# Patient Record
Sex: Female | Born: 1980 | Hispanic: No | Marital: Single | Smoking: Former smoker
Health system: Southern US, Community
[De-identification: ages and names within clinical notes are randomized; demographics above are authoritative.]

## PROBLEM LIST (undated history)

## (undated) DIAGNOSIS — F419 Anxiety disorder, unspecified: Secondary | ICD-10-CM

## (undated) DIAGNOSIS — F329 Major depressive disorder, single episode, unspecified: Secondary | ICD-10-CM

## (undated) DIAGNOSIS — F32A Depression, unspecified: Secondary | ICD-10-CM

## (undated) DIAGNOSIS — T7840XA Allergy, unspecified, initial encounter: Secondary | ICD-10-CM

## (undated) HISTORY — DX: Anxiety disorder, unspecified: F41.9

## (undated) HISTORY — DX: Allergy, unspecified, initial encounter: T78.40XA

## (undated) HISTORY — DX: Depression, unspecified: F32.A

## (undated) HISTORY — PX: COSMETIC SURGERY: SHX468

## (undated) HISTORY — DX: Major depressive disorder, single episode, unspecified: F32.9

---
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
## 2002-08-02 ENCOUNTER — Other Ambulatory Visit: Admission: RE | Admit: 2002-08-02 | Discharge: 2002-08-02 | Payer: Self-pay | Admitting: Obstetrics and Gynecology

## 2003-08-29 ENCOUNTER — Other Ambulatory Visit: Admission: RE | Admit: 2003-08-29 | Discharge: 2003-08-29 | Payer: Self-pay | Admitting: Obstetrics and Gynecology

## 2005-02-07 ENCOUNTER — Other Ambulatory Visit: Admission: RE | Admit: 2005-02-07 | Discharge: 2005-02-07 | Payer: Self-pay | Admitting: Obstetrics and Gynecology

## 2005-06-30 ENCOUNTER — Emergency Department (HOSPITAL_COMMUNITY): Admission: EM | Admit: 2005-06-30 | Discharge: 2005-06-30 | Payer: Self-pay | Admitting: Emergency Medicine

## 2005-07-02 ENCOUNTER — Emergency Department (HOSPITAL_COMMUNITY): Admission: EM | Admit: 2005-07-02 | Discharge: 2005-07-02 | Payer: Self-pay | Admitting: Emergency Medicine

## 2006-03-06 ENCOUNTER — Other Ambulatory Visit: Admission: RE | Admit: 2006-03-06 | Discharge: 2006-03-06 | Payer: Self-pay | Admitting: Obstetrics and Gynecology

## 2006-11-13 ENCOUNTER — Ambulatory Visit (HOSPITAL_BASED_OUTPATIENT_CLINIC_OR_DEPARTMENT_OTHER): Admission: RE | Admit: 2006-11-13 | Discharge: 2006-11-13 | Payer: Self-pay | Admitting: Urology

## 2013-04-16 ENCOUNTER — Ambulatory Visit: Payer: BC Managed Care – PPO | Admitting: Physician Assistant

## 2013-04-16 VITALS — BP 116/76 | HR 85 | Temp 99.0°F | Resp 16 | Ht 62.0 in | Wt 129.0 lb

## 2013-04-16 DIAGNOSIS — J029 Acute pharyngitis, unspecified: Secondary | ICD-10-CM

## 2013-04-16 DIAGNOSIS — R05 Cough: Secondary | ICD-10-CM

## 2013-04-16 DIAGNOSIS — J069 Acute upper respiratory infection, unspecified: Secondary | ICD-10-CM

## 2013-04-16 DIAGNOSIS — D7289 Other specified disorders of white blood cells: Secondary | ICD-10-CM

## 2013-04-16 DIAGNOSIS — R059 Cough, unspecified: Secondary | ICD-10-CM

## 2013-04-16 DIAGNOSIS — J040 Acute laryngitis: Secondary | ICD-10-CM

## 2013-04-16 LAB — GLUCOSE, POCT (MANUAL RESULT ENTRY): POC Glucose: 88 mg/dl (ref 70–99)

## 2013-04-16 LAB — POCT CBC
Granulocyte percent: 72.9 %G (ref 37–80)
HCT, POC: 41.4 % (ref 37.7–47.9)
Hemoglobin: 13.4 g/dL (ref 12.2–16.2)
Lymph, poc: 2.1 (ref 0.6–3.4)
MCH, POC: 30.5 pg (ref 27–31.2)
MCHC: 32.4 g/dL (ref 31.8–35.4)
MCV: 94.2 fL (ref 80–97)
MID (cbc): 0.7 (ref 0–0.9)
MPV: 9.7 fL (ref 0–99.8)
POC Granulocyte: 7.5 — AB (ref 2–6.9)
POC LYMPH PERCENT: 20.5 %L (ref 10–50)
POC MID %: 6.6 %M (ref 0–12)
Platelet Count, POC: 238 10*3/uL (ref 142–424)
RBC: 4.39 M/uL (ref 4.04–5.48)
RDW, POC: 13.2 %
WBC: 10.3 10*3/uL — AB (ref 4.6–10.2)

## 2013-04-16 LAB — POCT RAPID STREP A (OFFICE): Rapid Strep A Screen: NEGATIVE

## 2013-04-16 MED ORDER — HYDROCODONE-HOMATROPINE 5-1.5 MG/5ML PO SYRP: ORAL_SOLUTION | ORAL | Status: DC

## 2013-04-16 MED ORDER — PREDNISONE 20 MG PO TABS: ORAL_TABLET | ORAL | Status: DC

## 2013-04-16 MED ORDER — AMOXICILLIN-POT CLAVULANATE 875-125 MG PO TABS: 1.0000 | ORAL_TABLET | Freq: Two times a day (BID) | ORAL | Status: DC

## 2013-04-16 NOTE — Progress Notes (Signed)
Patient ID: Anne [name] MRN: [id number], DOB: December 24, 1980, 32 y.o. Date of Encounter: 04/16/2013, 4:55 PM  Primary Physician: No PCP Per Patient  Chief Complaint:  Chief Complaint  Patient presents with  . Sore Throat  . Hoarse    HPI: 32 y.o. female presents with day a 6 history of sore throat and a 1 day history of laryngitis. Subjective chills. Afebrile. Mild cough, congestion, and headache. Normal hearing. No GI complaints. Able to swallow saliva, but hurts to do so. Decreased appetite secondary to sore throat. Patient was initially seen at [facility] on 04/11/13, had a negative RST, diagnosed with viral pharyngitis, and given ibuprofen. No known sick contacts. 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      Past Medical History  Diagnosis Date  . Depression   . Allergy   . Anxiety      Home Meds: Prior to Admission medications   Medication Sig Start Date End Date Taking? Authorizing Provider  citalopram (CELEXA) 10 MG tablet Take 10 mg by mouth daily.   Yes Historical Provider, MD  clonazePAM (KLONOPIN) 0.5 MG tablet Take 0.5 mg by mouth 2 (two) times daily as needed for anxiety.   Yes Historical Provider, MD  methylphenidate (RITALIN LA) 30 MG 24 hr capsule Take 30 mg by mouth every morning.   Yes Historical Provider, MD    Allergies: No Known Allergies  History   Social History  . Marital Status: Single    Spouse Name: N/A    Number of Children: N/A  . Years of Education: N/A   Occupational History  . Not on file.   Social History Main Topics  . Smoking status: Former Games developer  . Smokeless tobacco: Not on file  . Alcohol Use: No  . Drug Use: No  . Sexually Active: Yes    Birth Control/ Protection: Pill   Other Topics Concern  . Not on file   Social History Narrative  . No narrative on file     Review of Systems: Constitutional: positive for chills. negative for fever, night sweats, fatigue, or weight  changes HEENT: see above Cardiovascular: negative for chest pain or palpitations Respiratory: positive for cough. negative for hemoptysis, wheezing, or shortness of breath Abdominal: negative for abdominal pain, nausea, vomiting or diarrhea Dermatological: negative for rash Neurologic: positive for headache   Physical Exam: Blood pressure 116/76, pulse 85, temperature 99 F (37.2 C), temperature source Oral, resp. rate 16, height 5\' 2"  (1.575 m), weight 129 lb (58.514 kg), last menstrual period 04/07/2013, SpO2 97.00%., Body mass index is 23.59 kg/(m^2). General: Well developed, well nourished, in no acute distress. Hoarse voice.  Head: Normocephalic, atraumatic, eyes without discharge, sclera non-icteric, nares are congested. Bilateral auditory canals clear, TM's are without perforation, pearly grey with reflective cone of light bilaterally. No sinus TTP. Oral cavity moist, dentition normal. Posterior pharynx erythematous with post nasal drip. No peritonsillar abscess or tonsillar exudate. Uvula midline.  Neck: Supple. No thyromegaly. Full ROM. Lymph nodes: less than 2 cm AC bilaterally. Lungs: Clear bilaterally to auscultation without wheezes, rales, or rhonchi. Breathing is unlabored. Heart: RRR with S1 S2. No murmurs, rubs, or gallops appreciated. Msk:  Strength and tone normal for age. Extremities: No clubbing or cyanosis. No edema. Neuro: Alert and oriented X 3. Moves all extremities spontaneously. CNII-XII grossly in tact. Psych:  Responds to questions appropriately with a normal affect.   Labs: Results for orders placed  in visit on 04/16/13  POCT CBC      Result Value Range   WBC 10.3 (*) 4.6 - 10.2 K/uL   Lymph, poc 2.1  0.6 - 3.4   POC LYMPH PERCENT 20.5  10 - 50 %L   MID (cbc) 0.7  0 - 0.9   POC MID % 6.6  0 - 12 %M   POC Granulocyte 7.5 (*) 2 - 6.9   Granulocyte percent 72.9  37 - 80 %G   RBC 4.39  4.04 - 5.48 M/uL   Hemoglobin 13.4  12.2 - 16.2 g/dL   HCT, POC 98.1   19.1 - 47.9 %   MCV 94.2  80 - 97 fL   MCH, POC 30.5  27 - 31.2 pg   MCHC 32.4  31.8 - 35.4 g/dL   RDW, POC 47.8     Platelet Count, POC 238  142 - 424 K/uL   MPV 9.7  0 - 99.8 fL  POCT RAPID STREP A (OFFICE)      Result Value Range   Rapid Strep A Screen Negative  Negative  GLUCOSE, POCT (MANUAL RESULT ENTRY)      Result Value Range   POC Glucose 88  70 - 99 mg/dl    Throat culture pending  ASSESSMENT AND PLAN:  32 y.o. female with pharyngitis, laryngitis, cough, and leukocytosis secondary to URI. -Augmentin 875/125 mg 1 po bid #20 no RF -Prednisone 20 mg #12 3x2, 2x2, 1x2 no RF -Hycodan #4oz 1 tsp po q 4-6 hours prn cough no RF SED -Voice rest -Tylenol prn -Rest/fluids -RTC precautions -RTC 3-5 days if no improvement  Signed, Eula Listen, PA-C 04/16/2013 4:55 PM

## 2013-04-19 LAB — CULTURE, GROUP A STREP: Organism ID, Bacteria: NORMAL

## 2013-04-22 ENCOUNTER — Telehealth: Payer: Self-pay

## 2013-04-22 NOTE — Telephone Encounter (Signed)
Patient states she is feeling weak and weird after taking medication that she was recently prescribed. [phone].

## 2013-04-22 NOTE — Telephone Encounter (Signed)
She should discontinue the hycodan; continue to take the antibiotic. She has stopped the cough meds already. I have advised her.

## 2015-02-23 ENCOUNTER — Ambulatory Visit: Payer: Self-pay | Attending: Internal Medicine

## 2021-01-30 ENCOUNTER — Other Ambulatory Visit: Payer: Self-pay | Admitting: Obstetrics and Gynecology

## 2021-01-30 DIAGNOSIS — O3670X0 Maternal care for viable fetus in abdominal pregnancy, unspecified trimester, not applicable or unspecified: Secondary | ICD-10-CM

## 2021-02-07 ENCOUNTER — Other Ambulatory Visit: Payer: Self-pay | Admitting: Obstetrics and Gynecology

## 2021-02-07 ENCOUNTER — Ambulatory Visit
Admission: RE | Admit: 2021-02-07 | Discharge: 2021-02-07 | Disposition: A | Discharge status: Home / Self Care | Payer: 59 | Source: Ambulatory Visit | Attending: Obstetrics and Gynecology | Admitting: Obstetrics and Gynecology

## 2021-02-07 ENCOUNTER — Other Ambulatory Visit: Payer: Self-pay

## 2021-02-07 DIAGNOSIS — O3670X0 Maternal care for viable fetus in abdominal pregnancy, unspecified trimester, not applicable or unspecified: Secondary | ICD-10-CM | POA: Diagnosis present

## 2021-07-08 ENCOUNTER — Other Ambulatory Visit (HOSPITAL_COMMUNITY): Payer: Self-pay | Admitting: Obstetrics and Gynecology

## 2021-07-08 ENCOUNTER — Other Ambulatory Visit: Payer: Self-pay

## 2021-07-08 ENCOUNTER — Ambulatory Visit (HOSPITAL_COMMUNITY)
Admission: RE | Admit: 2021-07-08 | Discharge: 2021-07-08 | Disposition: A | Discharge status: Home / Self Care | Payer: 59 | Source: Ambulatory Visit | Attending: Obstetrics and Gynecology | Admitting: Obstetrics and Gynecology

## 2021-07-08 DIAGNOSIS — M79604 Pain in right leg: Secondary | ICD-10-CM | POA: Diagnosis not present

## 2021-07-08 DIAGNOSIS — M79606 Pain in leg, unspecified: Secondary | ICD-10-CM | POA: Diagnosis not present

## 2021-09-05 ENCOUNTER — Inpatient Hospital Stay (HOSPITAL_COMMUNITY): Payer: 59 | Admitting: Anesthesiology

## 2021-09-05 ENCOUNTER — Encounter (HOSPITAL_COMMUNITY): Payer: Self-pay | Admitting: Obstetrics and Gynecology

## 2021-09-05 ENCOUNTER — Inpatient Hospital Stay (HOSPITAL_BASED_OUTPATIENT_CLINIC_OR_DEPARTMENT_OTHER): Payer: 59

## 2021-09-05 ENCOUNTER — Other Ambulatory Visit: Payer: Self-pay

## 2021-09-05 ENCOUNTER — Inpatient Hospital Stay (HOSPITAL_COMMUNITY)
Admission: AD | Admit: 2021-09-05 | Discharge: 2021-09-08 | DRG: 786 | Disposition: A | Discharge status: Home / Self Care | Payer: 59 | Attending: Obstetrics and Gynecology | Admitting: Obstetrics and Gynecology

## 2021-09-05 DIAGNOSIS — Z20822 Contact with and (suspected) exposure to covid-19: Secondary | ICD-10-CM | POA: Diagnosis present

## 2021-09-05 DIAGNOSIS — Z87891 Personal history of nicotine dependence: Secondary | ICD-10-CM | POA: Diagnosis not present

## 2021-09-05 DIAGNOSIS — Z3A39 39 weeks gestation of pregnancy: Secondary | ICD-10-CM | POA: Diagnosis not present

## 2021-09-05 DIAGNOSIS — Z3A40 40 weeks gestation of pregnancy: Secondary | ICD-10-CM

## 2021-09-05 DIAGNOSIS — O43123 Velamentous insertion of umbilical cord, third trimester: Secondary | ICD-10-CM | POA: Diagnosis present

## 2021-09-05 DIAGNOSIS — O4103X1 Oligohydramnios, third trimester, fetus 1: Secondary | ICD-10-CM | POA: Diagnosis present

## 2021-09-05 DIAGNOSIS — O411230 Chorioamnionitis, third trimester, not applicable or unspecified: Secondary | ICD-10-CM | POA: Diagnosis present

## 2021-09-05 DIAGNOSIS — O4103X0 Oligohydramnios, third trimester, not applicable or unspecified: Secondary | ICD-10-CM | POA: Diagnosis present

## 2021-09-05 LAB — TYPE AND SCREEN
ABO/RH(D): O POS
Antibody Screen: NEGATIVE

## 2021-09-05 LAB — CBC
HCT: 39.3 % (ref 36.0–46.0)
Hemoglobin: 12.9 g/dL (ref 12.0–15.0)
MCH: 30.8 pg (ref 26.0–34.0)
MCHC: 32.8 g/dL (ref 30.0–36.0)
MCV: 93.8 fL (ref 80.0–100.0)
Platelets: 234 10*3/uL (ref 150–400)
RBC: 4.19 MIL/uL (ref 3.87–5.11)
RDW: 13.1 % (ref 11.5–15.5)
WBC: 14.3 10*3/uL — ABNORMAL HIGH (ref 4.0–10.5)
nRBC: 0 % (ref 0.0–0.2)

## 2021-09-05 LAB — RESP PANEL BY RT-PCR (FLU A&B, COVID) ARPGX2
Influenza A by PCR: NEGATIVE
Influenza B by PCR: NEGATIVE
SARS Coronavirus 2 by RT PCR: NEGATIVE

## 2021-09-05 MED ORDER — OXYCODONE-ACETAMINOPHEN 5-325 MG PO TABS: 2.0000 | ORAL_TABLET | ORAL | Status: DC | PRN

## 2021-09-05 MED ORDER — EPHEDRINE 5 MG/ML INJ: 10.0000 mg | INTRAVENOUS | Status: DC | PRN

## 2021-09-05 MED ORDER — FENTANYL-BUPIVACAINE-NACL 0.5-0.125-0.9 MG/250ML-% EP SOLN
12.0000 mL/h | EPIDURAL | Status: DC | PRN
  Administered 2021-09-05: 12 mL/h via EPIDURAL
  Filled 2021-09-05: qty 250

## 2021-09-05 MED ORDER — PHENYLEPHRINE 40 MCG/ML (10ML) SYRINGE FOR IV PUSH (FOR BLOOD PRESSURE SUPPORT)
80.0000 ug | PREFILLED_SYRINGE | INTRAVENOUS | Status: AC | PRN
  Administered 2021-09-05 (×3): 80 ug via INTRAVENOUS

## 2021-09-05 MED ORDER — ACETAMINOPHEN 325 MG PO TABS: 650.0000 mg | ORAL_TABLET | ORAL | Status: DC | PRN

## 2021-09-05 MED ORDER — OXYTOCIN-SODIUM CHLORIDE 30-0.9 UT/500ML-% IV SOLN
1.0000 m[IU]/min | INTRAVENOUS | Status: DC
  Administered 2021-09-05: 2 m[IU]/min via INTRAVENOUS
  Filled 2021-09-05: qty 500

## 2021-09-05 MED ORDER — ONDANSETRON HCL 4 MG/2ML IJ SOLN
4.0000 mg | Freq: Four times a day (QID) | INTRAMUSCULAR | Status: DC | PRN
Start: 2021-09-05 — End: 2021-09-06

## 2021-09-05 MED ORDER — PHENYLEPHRINE 40 MCG/ML (10ML) SYRINGE FOR IV PUSH (FOR BLOOD PRESSURE SUPPORT)
80.0000 ug | PREFILLED_SYRINGE | INTRAVENOUS | Status: DC | PRN
  Filled 2021-09-05: qty 10

## 2021-09-05 MED ORDER — LACTATED RINGERS IV SOLN: INTRAVENOUS | Status: DC

## 2021-09-05 MED ORDER — EPHEDRINE 5 MG/ML INJ
10.0000 mg | INTRAVENOUS | Status: DC | PRN
  Administered 2021-09-05: 10 mg via INTRAVENOUS
  Filled 2021-09-05: qty 5

## 2021-09-05 MED ORDER — OXYTOCIN 10 UNIT/ML IJ SOLN: 10.0000 [IU] | Freq: Once | INTRAMUSCULAR | Status: DC

## 2021-09-05 MED ORDER — LACTATED RINGERS AMNIOINFUSION: INTRAVENOUS | Status: DC

## 2021-09-05 MED ORDER — LIDOCAINE HCL (PF) 1 % IJ SOLN
INTRAMUSCULAR | Status: DC | PRN
  Administered 2021-09-05: 8 mL via EPIDURAL

## 2021-09-05 MED ORDER — LACTATED RINGERS IV SOLN: 500.0000 mL | INTRAVENOUS | Status: DC | PRN

## 2021-09-05 MED ORDER — OXYTOCIN-SODIUM CHLORIDE 30-0.9 UT/500ML-% IV SOLN: 2.5000 [IU]/h | INTRAVENOUS | Status: DC

## 2021-09-05 MED ORDER — LACTATED RINGERS IV SOLN
500.0000 mL | Freq: Once | INTRAVENOUS | Status: AC
  Administered 2021-09-05: 500 mL via INTRAVENOUS

## 2021-09-05 MED ORDER — SOD CITRATE-CITRIC ACID 500-334 MG/5ML PO SOLN
30.0000 mL | ORAL | Status: DC | PRN
  Administered 2021-09-06: 30 mL via ORAL
  Filled 2021-09-05: qty 30

## 2021-09-05 MED ORDER — LIDOCAINE HCL (PF) 1 % IJ SOLN: 30.0000 mL | INTRAMUSCULAR | Status: DC | PRN

## 2021-09-05 MED ORDER — OXYTOCIN BOLUS FROM INFUSION: 333.0000 mL | Freq: Once | INTRAVENOUS | Status: DC

## 2021-09-05 MED ORDER — DIPHENHYDRAMINE HCL 50 MG/ML IJ SOLN: 12.5000 mg | INTRAMUSCULAR | Status: DC | PRN

## 2021-09-05 MED ORDER — TERBUTALINE SULFATE 1 MG/ML IJ SOLN: 0.2500 mg | Freq: Once | INTRAMUSCULAR | Status: DC | PRN

## 2021-09-05 MED ORDER — OXYCODONE-ACETAMINOPHEN 5-325 MG PO TABS
1.0000 | ORAL_TABLET | ORAL | Status: DC | PRN
Start: 2021-09-05 — End: 2021-09-06

## 2021-09-05 NOTE — Anesthesia Procedure Notes (Signed)
Epidural Patient location during procedure: OB Start time: 09/05/2021 8:05 PM End time: 09/05/2021 8:15 PM  Staffing Anesthesiologist: Mellody Dance, MD Performed: anesthesiologist   Preanesthetic Checklist Completed: patient identified, IV checked, site marked, risks and benefits discussed, monitors and equipment checked, pre-op evaluation and timeout performed  Epidural Patient position: sitting Prep: DuraPrep Patient monitoring: heart rate, cardiac monitor, continuous pulse ox and blood pressure Approach: midline Location: L2-L3 Injection technique: LOR saline  Needle:  Needle type: Tuohy  Needle gauge: 17 G Needle length: 9 cm Needle insertion depth: 5 cm Catheter type: closed end flexible Catheter size: 20 Guage Catheter at skin depth: 10 cm Test dose: negative and Other  Assessment Events: blood not aspirated, injection not painful, no injection resistance and negative IV test  Additional Notes Informed consent obtained prior to proceeding including risk of failure, 1% risk of PDPH, risk of minor discomfort and bruising.  Discussed rare but serious complications including epidural abscess, permanent nerve injury, epidural hematoma.  Discussed alternatives to epidural analgesia and patient desires to proceed.  Timeout performed pre-procedure verifying patient name, procedure, and platelet count.  Patient tolerated procedure well.

## 2021-09-05 NOTE — Progress Notes (Signed)
Pt sent from the office for BPP  due to Prolonged NR NST after c/o ctx since Monday.   Pt had declined induction for marginal cord insertion.  Called by RN regarding still  NR NST with  BPP 6/10 ( -2 for fluid) and deceleration in the ultrasound room. Called pt to advised of need for admission and delivery via induction. Pt reluctant. Advised her this is not an optional induction due to the findings and that she would have to sign out against medical advised. She then stated she would need to talk to her husband. Per RN, pt now agrees to be admitted. Orders placed. Defer  IV pain med due to tracing. May do nitrous oxide or epidural

## 2021-09-05 NOTE — Anesthesia Preprocedure Evaluation (Addendum)
Anesthesia Evaluation  Patient identified by MRN, date of birth, ID band Patient awake    Reviewed: Allergy & Precautions, NPO status , Patient's Chart, lab work & pertinent test results  Airway Mallampati: II  TM Distance: >3 FB Neck ROM: Full    Dental no notable dental hx.    Pulmonary neg pulmonary ROS, former smoker,    Pulmonary exam normal breath sounds clear to auscultation       Cardiovascular negative cardio ROS Normal cardiovascular exam Rhythm:Regular Rate:Normal     Neuro/Psych PSYCHIATRIC DISORDERS Anxiety Depression negative neurological ROS     GI/Hepatic negative GI ROS, Neg liver ROS,   Endo/Other  negative endocrine ROS  Renal/GU negative Renal ROS  negative genitourinary   Musculoskeletal negative musculoskeletal ROS (+)   Abdominal   Peds negative pediatric ROS (+)  Hematology negative hematology ROS (+)   Anesthesia Other Findings   Reproductive/Obstetrics (+) Pregnancy                             Anesthesia Physical Anesthesia Plan  ASA: 2  Anesthesia Plan: Epidural   Post-op Pain Management:    Induction:   PONV Risk Score and Plan: 2 and Treatment may vary due to age or medical condition  Airway Management Planned: Natural Airway  Additional Equipment:   Intra-op Plan:   Post-operative Plan:   Informed Consent: I have reviewed the patients History and Physical, chart, labs and discussed the procedure including the risks, benefits and alternatives for the proposed anesthesia with the patient or authorized representative who has indicated his/her understanding and acceptance.       Plan Discussed with: Anesthesiologist  Anesthesia Plan Comments: (C section called by Dr. Cherly Hensen. Plan to dose epidural for surgical anesthesia, patient has been comfortable all night. GETA/RSI as backup plan. R/B/O discussed with patient and she agrees with plan.  Tanna Furry, MD  )       Anesthesia Quick Evaluation

## 2021-09-05 NOTE — Progress Notes (Signed)
Tracing reviewed: baseline 140 (+) variables lasting  ? Freq ctx.  Given the intermittent variables down to 90-100 lasting 40-60 sec W/ and w/o ctx AROM scant thick mec IUPC/ISE placed   IMP: variables due to cord compression from oligohydramnios Term P) amnioinfusion. Pitocin. Nitrous oxide, right exaggerated sims position( LOA asynclitic)

## 2021-09-05 NOTE — MAU Note (Signed)
Pt reports her doctor wants her to have the baby monitored and have an ultrasound.   Denies vaginal bleeding.   Denies LOF  Reports +FM

## 2021-09-05 NOTE — H&P (Signed)
Anne [name] is a 40 y.o. female presenting for IOL 2nd to oligohydramnios at term. Denies SROM. (+) FM c/o ctx and cramping since Monday. NST NR with variables. OB History     Gravida  1   Para      Term      Preterm      AB      Living         SAB      IAB      Ectopic      Multiple      Live Births             Past Medical History:  Diagnosis Date   Allergy    Anxiety    Depression    Past Surgical History:  Procedure Laterality Date   COSMETIC SURGERY     Family History: family history includes Cancer in her maternal grandmother; Diabetes in her mother. Social History:  reports that she has quit smoking. She has never used smokeless tobacco. She reports that she does not drink alcohol and does not use drugs.     Maternal Diabetes: No Genetic Screening: Normal Maternal Ultrasounds/Referrals: Normal Fetal Ultrasounds or other Referrals:  None Maternal Substance Abuse:  No Significant Maternal Medications:  None Significant Maternal Lab Results:  Group B Strep negative Other Comments:   marginal cord insertion  Review of Systems  All other systems reviewed and are negative. History   Blood pressure 113/73, pulse 90, temperature 97.6 F (36.4 C), temperature source Oral, resp. rate 20, height 5' 3.5" (1.613 m), weight 72.1 kg, SpO2 97 %. Maternal Exam:  Introitus: Normal vulva.  Physical Exam Constitutional:      Appearance: Normal appearance.  Cardiovascular:     Rate and Rhythm: Regular rhythm.     Heart sounds: Normal heart sounds.  Pulmonary:     Breath sounds: Normal breath sounds.  Abdominal:     Palpations: Abdomen is soft.  Genitourinary:    General: Normal vulva.  Musculoskeletal:     Cervical back: Neck supple.  Skin:    General: Skin is warm and dry.  Neurological:     Mental Status: She is alert and oriented to person, place, and time.  Psychiatric:        Mood and Affect: Mood normal.        Behavior: Behavior  normal.   VE 1/100/-1   Now  Prenatal labs: ABO, Rh:  O positive Antibody:  negative Rubella:  Immune RPR:   NR HBsAg:   neg HIV:   neg GBS:   neg [place] MFM Fetal BPP Wo Non Stress  Result Date: 09/05/2021 ----------------------------------------------------------------------  OBSTETRICS REPORT                       (Signed Final 09/05/2021 04:33 pm) ---------------------------------------------------------------------- Patient Info  ID #:       [id number]                          D.O.B.:  1981-07-08 (40 yrs)  Name:       Anne [name]               Visit Date: 09/05/2021 03:14 pm ---------------------------------------------------------------------- Performed By  Attending:        Noralee Space MD        Referred By:      [facility] MAU/Triage  Performed By:     Percell Boston  [facility] ---------------------------------------------------------------------- Orders  #  Description                           Code        Ordered By  1  [place] MFM FETAL BPP WO NON               [id number]    Kache Mcclurg     STRESS                                            Styles Fambro ----------------------------------------------------------------------  #  Order #                     Accession #                Episode #  1  [id number]                   [id number]                 [id number] ---------------------------------------------------------------------- Indications  [redacted] weeks gestation of pregnancy                Z3A.39  Non-reactive NST                               O28.9  Oligohydraminios, third trimester, unspecified O41.03X0 ---------------------------------------------------------------------- Fetal Evaluation  Num Of Fetuses:         1  Fetal Heart Rate(bpm):  113  Cardiac Activity:       Observed  Presentation:           Cephalic  Placenta:               Fundal  Amniotic Fluid  AFI FV:      Oligohydramnios  AFI Sum(cm)     %Tile        Largest Pocket(cm)  2.5             < 3         2.5  RUQ(cm)       RLQ(cm)       LUQ(cm)        LLQ(cm)  2.5           0             0              0 ---------------------------------------------------------------------- Biophysical Evaluation  Amniotic F.V:   Pocket => 2 cm             F. Tone:        Observed  F. Movement:    Observed                   Score:          8/8  F. Breathing:   Observed ---------------------------------------------------------------------- OB History  Gravidity:  1         Term:   0        Prem:   0        SAB:   0  TOP:          0       Ectopic:  0        Living: 0 ---------------------------------------------------------------------- Gestational Age  Clinical EDD:  39w 6d                                        EDD:   09/06/21  Best:          39w 6d     Det. By:  Clinical EDD             EDD:   09/06/21 ---------------------------------------------------------------------- Anatomy  Stomach:               Appears normal, left   Bladder:                Appears normal                         sided ---------------------------------------------------------------------- Impression  BPP was requested because of nonreactive NST.  A single deepest vertical pocket of >2 cm of amniotic fluid  was seen. Oligohydramnios (AFI<5 cm). BPP 8/8. ---------------------------------------------------------------------- Recommendations  Recommend delivery. ----------------------------------------------------------------------                  Noralee Space, MD Electronically Signed Final Report   09/05/2021 04:33 pm ----------------------------------------------------------------------   Assessment/Plan: Oligohydramnios Variable decelerations due to cord compression Term gestation BPP 6/10 Marginal cord insertion P) admit routine labs. Analgesic disc( decline epidural). IV pitocin. Nitrous oxide  Ryn Peine A Analeya Luallen 09/05/2021, 5:31 PM

## 2021-09-06 ENCOUNTER — Encounter (HOSPITAL_COMMUNITY)
Admission: AD | Disposition: A | Discharge status: Home / Self Care | Payer: Self-pay | Source: Home / Self Care | Attending: Obstetrics and Gynecology

## 2021-09-06 ENCOUNTER — Encounter (HOSPITAL_COMMUNITY): Payer: Self-pay | Admitting: Obstetrics and Gynecology

## 2021-09-06 LAB — RPR: RPR Ser Ql: NONREACTIVE

## 2021-09-06 SURGERY — Surgical Case
Anesthesia: Epidural

## 2021-09-06 MED ORDER — SCOPOLAMINE 1 MG/3DAYS TD PT72
1.0000 | MEDICATED_PATCH | Freq: Once | TRANSDERMAL | Status: DC
  Administered 2021-09-06: 1.5 mg via TRANSDERMAL
  Filled 2021-09-06: qty 1

## 2021-09-06 MED ORDER — SODIUM CHLORIDE 0.9 % IV SOLN
2.0000 g | Freq: Four times a day (QID) | INTRAVENOUS | Status: AC
  Administered 2021-09-06 – 2021-09-07 (×4): 2 g via INTRAVENOUS
  Filled 2021-09-06 (×4): qty 2000

## 2021-09-06 MED ORDER — NALBUPHINE HCL 10 MG/ML IJ SOLN: 5.0000 mg | Freq: Once | INTRAMUSCULAR | Status: DC | PRN

## 2021-09-06 MED ORDER — MORPHINE SULFATE (PF) 0.5 MG/ML IJ SOLN
INTRAMUSCULAR | Status: AC
  Filled 2021-09-06: qty 10

## 2021-09-06 MED ORDER — PHENYLEPHRINE HCL (PRESSORS) 10 MG/ML IV SOLN
INTRAVENOUS | Status: DC | PRN
  Administered 2021-09-06 (×2): 80 ug via INTRAVENOUS

## 2021-09-06 MED ORDER — BUPIVACAINE HCL (PF) 0.25 % IJ SOLN
INTRAMUSCULAR | Status: AC
  Filled 2021-09-06: qty 30

## 2021-09-06 MED ORDER — COCONUT OIL OIL: 1.0000 "application " | TOPICAL_OIL | Status: DC | PRN

## 2021-09-06 MED ORDER — OXYCODONE HCL 5 MG PO TABS: 5.0000 mg | ORAL_TABLET | ORAL | Status: DC | PRN

## 2021-09-06 MED ORDER — WITCH HAZEL-GLYCERIN EX PADS: 1.0000 "application " | MEDICATED_PAD | CUTANEOUS | Status: DC | PRN

## 2021-09-06 MED ORDER — NALBUPHINE HCL 10 MG/ML IJ SOLN: 5.0000 mg | INTRAMUSCULAR | Status: DC | PRN

## 2021-09-06 MED ORDER — LACTATED RINGERS IV SOLN: INTRAVENOUS | Status: DC

## 2021-09-06 MED ORDER — GENTAMICIN SULFATE 40 MG/ML IJ SOLN
5.0000 mg/kg | Freq: Once | INTRAVENOUS | Status: AC
  Administered 2021-09-06: 360 mg via INTRAVENOUS
  Filled 2021-09-06: qty 9

## 2021-09-06 MED ORDER — PHENYLEPHRINE 40 MCG/ML (10ML) SYRINGE FOR IV PUSH (FOR BLOOD PRESSURE SUPPORT)
PREFILLED_SYRINGE | INTRAVENOUS | Status: AC
  Filled 2021-09-06: qty 10

## 2021-09-06 MED ORDER — KETOROLAC TROMETHAMINE 30 MG/ML IJ SOLN: 30.0000 mg | Freq: Four times a day (QID) | INTRAMUSCULAR | Status: AC | PRN

## 2021-09-06 MED ORDER — ACETAMINOPHEN 500 MG PO TABS: 1000.0000 mg | ORAL_TABLET | Freq: Four times a day (QID) | ORAL | Status: DC

## 2021-09-06 MED ORDER — MENTHOL 3 MG MT LOZG: 1.0000 | LOZENGE | OROMUCOSAL | Status: DC | PRN

## 2021-09-06 MED ORDER — LACTATED RINGERS IV SOLN: INTRAVENOUS | Status: DC | PRN

## 2021-09-06 MED ORDER — SODIUM CHLORIDE 0.9% FLUSH: 3.0000 mL | INTRAVENOUS | Status: DC | PRN

## 2021-09-06 MED ORDER — SIMETHICONE 80 MG PO CHEW
80.0000 mg | CHEWABLE_TABLET | Freq: Three times a day (TID) | ORAL | Status: DC
  Administered 2021-09-07 – 2021-09-08 (×2): 80 mg via ORAL
  Filled 2021-09-06 (×5): qty 1

## 2021-09-06 MED ORDER — DROPERIDOL 2.5 MG/ML IJ SOLN: 0.6250 mg | Freq: Once | INTRAMUSCULAR | Status: DC | PRN

## 2021-09-06 MED ORDER — MORPHINE SULFATE (PF) 0.5 MG/ML IJ SOLN
INTRAMUSCULAR | Status: DC | PRN
  Administered 2021-09-06: 3 mg via EPIDURAL

## 2021-09-06 MED ORDER — METHYLERGONOVINE MALEATE 0.2 MG/ML IJ SOLN
INTRAMUSCULAR | Status: AC
  Filled 2021-09-06: qty 1

## 2021-09-06 MED ORDER — FENTANYL CITRATE (PF) 100 MCG/2ML IJ SOLN
INTRAMUSCULAR | Status: AC
  Filled 2021-09-06: qty 2

## 2021-09-06 MED ORDER — ZOLPIDEM TARTRATE 5 MG PO TABS: 5.0000 mg | ORAL_TABLET | Freq: Every evening | ORAL | Status: DC | PRN

## 2021-09-06 MED ORDER — FENTANYL CITRATE (PF) 100 MCG/2ML IJ SOLN: 25.0000 ug | INTRAMUSCULAR | Status: DC | PRN

## 2021-09-06 MED ORDER — PROMETHAZINE HCL 25 MG/ML IJ SOLN: 6.2500 mg | INTRAMUSCULAR | Status: DC | PRN

## 2021-09-06 MED ORDER — OXYCODONE HCL 5 MG PO TABS
5.0000 mg | ORAL_TABLET | Freq: Once | ORAL | Status: DC | PRN
Start: 2021-09-06 — End: 2021-09-06

## 2021-09-06 MED ORDER — LACTATED RINGERS IV BOLUS
2000.0000 mL | Freq: Once | INTRAVENOUS | Status: AC
  Administered 2021-09-06: 1000 mL via INTRAVENOUS

## 2021-09-06 MED ORDER — BUPIVACAINE HCL (PF) 0.25 % IJ SOLN
INTRAMUSCULAR | Status: DC | PRN
  Administered 2021-09-06: 7 mL

## 2021-09-06 MED ORDER — OXYTOCIN-SODIUM CHLORIDE 30-0.9 UT/500ML-% IV SOLN
INTRAVENOUS | Status: DC | PRN
  Administered 2021-09-06: 300 mL via INTRAVENOUS

## 2021-09-06 MED ORDER — CLINDAMYCIN PHOSPHATE 900 MG/50ML IV SOLN
900.0000 mg | Freq: Three times a day (TID) | INTRAVENOUS | Status: AC
  Administered 2021-09-06 – 2021-09-07 (×3): 900 mg via INTRAVENOUS
  Filled 2021-09-06 (×3): qty 50

## 2021-09-06 MED ORDER — LIDOCAINE-EPINEPHRINE (PF) 2 %-1:200000 IJ SOLN
INTRAMUSCULAR | Status: DC | PRN
  Administered 2021-09-06: 5 mL via EPIDURAL

## 2021-09-06 MED ORDER — OXYTOCIN-SODIUM CHLORIDE 30-0.9 UT/500ML-% IV SOLN
2.5000 [IU]/h | INTRAVENOUS | Status: AC
  Administered 2021-09-06: 2.5 [IU]/h via INTRAVENOUS

## 2021-09-06 MED ORDER — SIMETHICONE 80 MG PO CHEW
80.0000 mg | CHEWABLE_TABLET | ORAL | Status: DC | PRN
  Administered 2021-09-08: 80 mg via ORAL
  Filled 2021-09-06: qty 1

## 2021-09-06 MED ORDER — METHYLERGONOVINE MALEATE 0.2 MG/ML IJ SOLN
INTRAMUSCULAR | Status: DC | PRN
  Administered 2021-09-06: .2 mg via INTRAMUSCULAR

## 2021-09-06 MED ORDER — OXYCODONE HCL 5 MG/5ML PO SOLN: 5.0000 mg | Freq: Once | ORAL | Status: DC | PRN

## 2021-09-06 MED ORDER — NALOXONE HCL 4 MG/10ML IJ SOLN
1.0000 ug/kg/h | INTRAVENOUS | Status: DC | PRN
  Filled 2021-09-06: qty 5

## 2021-09-06 MED ORDER — ACETAMINOPHEN 500 MG PO TABS
1000.0000 mg | ORAL_TABLET | Freq: Four times a day (QID) | ORAL | Status: DC | PRN
  Administered 2021-09-06: 1000 mg via ORAL
  Filled 2021-09-06: qty 2

## 2021-09-06 MED ORDER — PRENATAL MULTIVITAMIN CH
1.0000 | ORAL_TABLET | Freq: Every day | ORAL | Status: DC
  Administered 2021-09-08: 1 via ORAL
  Filled 2021-09-06 (×2): qty 1

## 2021-09-06 MED ORDER — ONDANSETRON HCL 4 MG/2ML IJ SOLN
4.0000 mg | Freq: Three times a day (TID) | INTRAMUSCULAR | Status: DC | PRN
  Administered 2021-09-06: 4 mg via INTRAVENOUS
  Filled 2021-09-06: qty 2

## 2021-09-06 MED ORDER — DIPHENHYDRAMINE HCL 25 MG PO CAPS: 25.0000 mg | ORAL_CAPSULE | Freq: Four times a day (QID) | ORAL | Status: DC | PRN

## 2021-09-06 MED ORDER — ONDANSETRON HCL 4 MG/2ML IJ SOLN
INTRAMUSCULAR | Status: AC
  Filled 2021-09-06: qty 2

## 2021-09-06 MED ORDER — DIBUCAINE (PERIANAL) 1 % EX OINT: 1.0000 "application " | TOPICAL_OINTMENT | CUTANEOUS | Status: DC | PRN

## 2021-09-06 MED ORDER — SODIUM CHLORIDE 0.9 % IR SOLN
Status: DC | PRN
  Administered 2021-09-06 (×2): 1000 mL

## 2021-09-06 MED ORDER — CLINDAMYCIN PHOSPHATE 900 MG/50ML IV SOLN
900.0000 mg | Freq: Three times a day (TID) | INTRAVENOUS | Status: DC
  Administered 2021-09-06: 900 mg via INTRAVENOUS
  Filled 2021-09-06 (×2): qty 50

## 2021-09-06 MED ORDER — NALOXONE HCL 0.4 MG/ML IJ SOLN: 0.4000 mg | INTRAMUSCULAR | Status: DC | PRN

## 2021-09-06 MED ORDER — ONDANSETRON HCL 4 MG/2ML IJ SOLN
INTRAMUSCULAR | Status: DC | PRN
  Administered 2021-09-06: 4 mg via INTRAVENOUS

## 2021-09-06 MED ORDER — IBUPROFEN 600 MG PO TABS
600.0000 mg | ORAL_TABLET | Freq: Four times a day (QID) | ORAL | Status: DC
  Administered 2021-09-07 – 2021-09-08 (×5): 600 mg via ORAL
  Filled 2021-09-06 (×8): qty 1

## 2021-09-06 MED ORDER — DIPHENHYDRAMINE HCL 25 MG PO CAPS: 25.0000 mg | ORAL_CAPSULE | ORAL | Status: DC | PRN

## 2021-09-06 MED ORDER — SODIUM CHLORIDE 0.9 % IV SOLN
2.0000 g | Freq: Four times a day (QID) | INTRAVENOUS | Status: DC
  Administered 2021-09-06: 2 g via INTRAVENOUS
  Filled 2021-09-06: qty 2000

## 2021-09-06 MED ORDER — KETOROLAC TROMETHAMINE 30 MG/ML IJ SOLN
INTRAMUSCULAR | Status: AC
  Filled 2021-09-06: qty 1

## 2021-09-06 MED ORDER — FENTANYL CITRATE (PF) 100 MCG/2ML IJ SOLN
INTRAMUSCULAR | Status: DC | PRN
  Administered 2021-09-06: 100 ug via EPIDURAL

## 2021-09-06 MED ORDER — MEPERIDINE HCL 25 MG/ML IJ SOLN: 6.2500 mg | INTRAMUSCULAR | Status: DC | PRN

## 2021-09-06 MED ORDER — DEXAMETHASONE SODIUM PHOSPHATE 10 MG/ML IJ SOLN
INTRAMUSCULAR | Status: DC | PRN
  Administered 2021-09-06: 10 mg via INTRAVENOUS

## 2021-09-06 MED ORDER — SENNOSIDES-DOCUSATE SODIUM 8.6-50 MG PO TABS
2.0000 | ORAL_TABLET | ORAL | Status: DC
  Administered 2021-09-07 – 2021-09-08 (×2): 2 via ORAL
  Filled 2021-09-06 (×2): qty 2

## 2021-09-06 MED ORDER — DIPHENHYDRAMINE HCL 50 MG/ML IJ SOLN: 12.5000 mg | INTRAMUSCULAR | Status: DC | PRN

## 2021-09-06 MED ORDER — ACETAMINOPHEN 500 MG PO TABS
1000.0000 mg | ORAL_TABLET | Freq: Four times a day (QID) | ORAL | Status: DC
  Administered 2021-09-07 – 2021-09-08 (×5): 1000 mg via ORAL
  Filled 2021-09-06 (×8): qty 2

## 2021-09-06 SURGICAL SUPPLY — 45 items

## 2021-09-06 NOTE — Lactation Note (Signed)
This note was copied from a baby's chart. Lactation Consultation Note  Patient Name: Anne [name] QMGNO'I Date: 09/06/2021 Reason for consult: Initial assessment;Primapara;1st time breastfeeding;Term Age:38 hours  LC in to room for initial consult. Infant is currently skin to skin due to low tem. Mother reports infant has latched twice since delivery. Mother states "it feels weird". LC talked about breastfeeding as a new ability that requires practice and patience. Demonstrated hand expression, collected ~1 mL and rubbed to infant's gums.  Mother states she may need a hand pump.  Reviewed normal newborn behavior during first 24h, expected output, tummy size and feeding frequency.  Mother inquires about LC visit charges. LC explained LC services are included in their stay.   Plan: 1-Skin to skin, aim for a deep, comfortable latch and breastfeed on demand or 8-12 times in 24h period. 2-Encouraged maternal rest, hydration and food intake.  3-Contact LC as needed for feeds/support/concerns/questions   All questions answered at this time. Provided Lactation services brochure and promoted [employer] booklet information.    Maternal Data Has patient been taught Hand Expression?: Yes Does the patient have breastfeeding experience prior to this delivery?: No  Feeding Mother's Current Feeding Choice: Breast Milk  LATCH Score Latch: Grasps breast easily, tongue down, lips flanged, rhythmical sucking.  Audible Swallowing: A few with stimulation  Type of Nipple: Everted at rest and after stimulation  Comfort (Breast/Nipple): Soft / non-tender  Hold (Positioning): Assistance needed to correctly position infant at breast and maintain latch.  LATCH Score: 8  Interventions Interventions: Breast feeding basics reviewed;Skin to skin;Breast massage;Hand express;Expressed milk;Education;LC Services brochure  Discharge Pump: Personal WIC Program: No  Consult Status Consult Status:  Follow-up Date: 09/07/21 Follow-up type: In-patient    Arling Cerone A Higuera Ancidey 09/06/2021, 11:37 AM

## 2021-09-06 NOTE — Transfer of Care (Signed)
Immediate Anesthesia Transfer of Care Note  Patient: Anne [name]  Procedure(s) Performed: CESAREAN SECTION  Patient Location: PACU  Anesthesia Type:Epidural  Level of Consciousness: awake  Airway & Oxygen Therapy: Patient Spontanous Breathing  Post-op Assessment: Report given to RN and Post -op Vital signs reviewed and stable  Post vital signs: Reviewed and stable  Last Vitals:  Vitals Value Taken Time  BP 98/60 09/06/21 0812  Temp    Pulse 95 09/06/21 0815  Resp 21 09/06/21 0815  SpO2 97 % 09/06/21 0815  Vitals shown include unvalidated device data.  Last Pain:  Vitals:   09/06/21 0605  TempSrc: Axillary  PainSc:          Complications: No notable events documented.

## 2021-09-06 NOTE — Progress Notes (Signed)
Called for deep variable declerations On arrival  Tracing reviewed: baseline 155  minimal variability Fetal heart rate deceleration down to the 50's-60   lasting  Up to one minute. Ctx q 1-2 mins  IMP: severe variable deceleration Due to cord  compression Non reassuring fetal tracing. Pt advised of the  Need for C/S. Pt declines C/S after extensive counselling. Will stop pitocin at this point Dr Cherly Hensen

## 2021-09-06 NOTE — Anesthesia Postprocedure Evaluation (Signed)
Anesthesia Post Note  Patient: Anne [name]  Procedure(s) Performed: CESAREAN SECTION     Patient location during evaluation: PACU Anesthesia Type: Epidural Level of consciousness: oriented and awake and alert Pain management: pain level controlled Vital Signs Assessment: post-procedure vital signs reviewed and stable Respiratory status: spontaneous breathing, respiratory function stable and nonlabored ventilation Cardiovascular status: blood pressure returned to baseline and stable Postop Assessment: no headache, no backache, no apparent nausea or vomiting, epidural receding and patient able to bend at knees Anesthetic complications: no   No notable events documented.  Last Vitals:  Vitals:   09/06/21 0847 09/06/21 0900  BP: (!) 94/58 (!) 89/64  Pulse: 93 92  Resp: (!) 23 16  Temp:    SpO2: 97% 96%    Last Pain:  Vitals:   09/06/21 0845  TempSrc: Oral  PainSc: 0-No pain   Pain Goal:    LLE Motor Response: Purposeful movement (09/06/21 0845)   RLE Motor Response: Purposeful movement (09/06/21 0845)       Epidural/Spinal Function Cutaneous sensation: Able to Discern Pressure (09/06/21 0845), Patient able to flex knees: Yes (09/06/21 0845), Patient able to lift hips off bed: Yes (09/06/21 0845), Back pain beyond tenderness at insertion site: No (09/06/21 0845), Progressively worsening motor and/or sensory loss: No (09/06/21 0845), Bowel and/or bladder incontinence post epidural: No (09/06/21 0845)  Gail Vendetti A.

## 2021-09-06 NOTE — Progress Notes (Signed)
Called by RN regarding temp 101.2( axillary). Repeat 100.4  Tracing: baseline 170 min variability  Pt shaking. Agrees to proceed with C/S C/S risk reviewed including infection, bleeding, poss need for blood Transfusion, injury to bladder, bowels, ureter( 15%). Possible need for C/S in the future/ all ? Answered. OR notified

## 2021-09-06 NOTE — Brief Op Note (Signed)
09/05/2021 - 09/06/2021  6:55 AM  PATIENT:  Anne [name]  40 y.o. female  PRE-OPERATIVE DIAGNOSIS: fetal intolerance to labor, arrest of dilation, presumed chorioamnionitis  POST-OPERATIVE DIAGNOSIS:  same  PROCEDURE:  Primary Cesarean section, kerr hysterotomy  SURGEON:  Surgeon(s) and Role:    * Maxie Better, MD - Primary  PHYSICIAN ASSISTANT:   ASSISTANTS: none   ANESTHESIA:   epidural FINDINGS;  live female OA. CAN x 2,  true knot x 1 in the cord. Meconium fluid, marginal placenta, nl tubes and ovaries EBL:  836 ml   BLOOD ADMINISTERED:none  DRAINS: none   LOCAL MEDICATIONS USED:  MARCAINE     SPECIMEN:  Source of Specimen:  placenta  DISPOSITION OF SPECIMEN:  PATHOLOGY  COUNTS:  YES  TOURNIQUET:  * No tourniquets in log *  DICTATION: .Other Dictation: Dictation Number [id number]  PLAN OF CARE: Admit to inpatient   PATIENT DISPOSITION:  PACU - hemodynamically stable.   Delay start of Pharmacological VTE agent (>24hrs) due to surgical blood loss or risk of bleeding: no

## 2021-09-06 NOTE — Progress Notes (Signed)
Dr. Cherly Hensen at bedside. Reviewed FHR strip. C/S recommended to patient due to fetal heart rate. Patient refuses at this time. Pitocin turned off. Risks and benefits of continuing with patient plan of care discussed. Patient verbalizes understanding.

## 2021-09-06 NOTE — Progress Notes (Addendum)
S: no complaint  O: BP (!) 106/57   Pulse (!) 120   Temp 97.8 F (36.6 C) (Axillary)   Resp 16   Ht 5' 3.5" (1.613 m)   Wt 72.1 kg   SpO2 99%   BMI 27.72 kg/m   VE deferred Pitocin off Tracing: baseline 155-160 bpm Min variability (+) intermittent variables amnioinfusion Ctx : undulating  IMP; variable decelerations  Term gestation P)mgmt per pt   Addendum VE 6/90/+1  lop

## 2021-09-06 NOTE — Progress Notes (Signed)
S: epidural  O: pitocin 6 miu BP (!) 104/52   Pulse 95   Temp 98 F (36.7 C) (Oral)   Resp 19   Ht 5' 3.5" (1.613 m)   Wt 72.1 kg   SpO2 99%   BMI 27.72 kg/m   VE 4-5/80/+1 small caput.LOP Tracing" baseline 150 med variability Ctx q 2 mins( 175-185 MVU IUPC/ISE  Amnioinfusion ongoing  IMP: arrest of dilaiton P) flying cowgirl. Cont pitocin

## 2021-09-07 LAB — CBC
HCT: 27.6 % — ABNORMAL LOW (ref 36.0–46.0)
Hemoglobin: 9.7 g/dL — ABNORMAL LOW (ref 12.0–15.0)
MCH: 31.9 pg (ref 26.0–34.0)
MCHC: 35.1 g/dL (ref 30.0–36.0)
MCV: 90.8 fL (ref 80.0–100.0)
Platelets: 177 10*3/uL (ref 150–400)
RBC: 3.04 MIL/uL — ABNORMAL LOW (ref 3.87–5.11)
RDW: 12.9 % (ref 11.5–15.5)
WBC: 26.7 10*3/uL — ABNORMAL HIGH (ref 4.0–10.5)
nRBC: 0 % (ref 0.0–0.2)

## 2021-09-07 NOTE — Op Note (Signed)
NAME: Anne [name], Anne [name] MEDICAL RECORD NO: [id number] ACCOUNT NO: [id number] DATE OF BIRTH: December 26, 1980 FACILITY: [facility] LOCATION: [facility] PHYSICIAN: Anamarie Hunn A. Cherly Hensen, MD  Operative Report   DATE OF PROCEDURE: 09/06/2021  PREOPERATIVE DIAGNOSES:  Fetal intolerance to labor, presumed chorioamnionitis, arrest of dilatation, term gestation.  POSTOPERATIVE DIAGNOSES:  Fetal intolerance to labor, presumed chorioamnionitis, arrest of dilatation, term gestation.  PROCEDURES:  Primary cesarean section, Kerr hysterotomy.  ANESTHESIA:  Epidural.  SURGEON:  Lateefah Mallery A. Cherly Hensen, MD.  ASSISTANT:  None.  DESCRIPTION OF PROCEDURE:  Under adequate epidural anesthesia, the patient was placed in the supine position with a left lateral tilt.  She was sterilely prepped and draped in the usual fashion.  Indwelling Foley catheter was already in place.  0.25%  Marcaine was injected along the planned Pfannenstiel skin incision site.  Pfannenstiel skin incision was then made, carried down to the rectus fascia.  Rectus fascia opened transversely.  Rectus fascia was then bluntly and sharply dissected off the  rectus muscles in a superior and inferior fashion.  Rectus muscle was split in the midline.  The parietal peritoneum was entered sharply and extended.  A self-retaining Alexis retractor was then placed.  Vesicouterine peritoneum was opened transversely.   Bladder was displaced inferiorly with blunt dissection.  A curvilinear low transverse incision was made and extended in a cephalic and caudal manner using blunt dissection. Copious meconium fluid was encountered.  Fetus with multiple nuchal cord was noted.   Subsequent delivery of a live female from the occiput anterior position with nuchal cord x2 and a true knot in the cord was noted.  The baby was bulb suctioned on the abdomen. Due to a little bit floppy, delayed cord clamping was deferred and the cord was clamped, cut and the baby was  transferred to the  waiting pediatricians who assigned Apgars of 8 and 9 at one and five minutes.  Placenta was manually removed.  Uterine cavity was cleaned of debris, extraction of the placenta, meconium staining as well as a marginal cord insertion.   Uterine cavity was cleared of debris.  Uterine incision had no extension.  Uterine incision was closed in two layers, the first layer with 0 Monocryl in running locked stitch, second layer was imbricated using 0 Monocryl suture.  Bleeding in the medial  aspect resulted in a figure-of-eight sutures being placed.  Normal tubes and ovaries were noted bilaterally.  Abdomen was irrigated, suctioned of debris.  Interceed was placed overlying the incision in an inverted T fashion.  The Alexis retractor was  then removed.  The parietal peritoneum was closed with 2-0 Vicryl and the rectus fascia was closed with 0 Vicryl x2.  The subcutaneous area was irrigated.  Small bleeders cauterized.  Interrupted 2-0 plain sutures placed and the skin approximated using  4-0 Vicryl suture.  Steri-Strips and benzoin was placed.  SPECIMENS:  Placenta sent to Pathology.   ESTIMATED BLOOD LOSS: 836 mL  INTRAOPERATIVE FLUIDS:  2 liters.  URINE OUTPUT:  125 mL   COUNTS:  Sponge and instrument counts x2 was correct.   COMPLICATION: None.    The patient tolerated the procedure well and was transferred to recovery room in stable condition.  The baby remained in the operating room.   PAA D: 09/07/2021 5:27:25 am T: 09/07/2021 6:02:00 am  JOB: [id number]/ [id number]

## 2021-09-07 NOTE — Lactation Note (Signed)
This note was copied from a baby's chart. Lactation Consultation Note  Patient Name: Anne [name] Date: 09/07/2021 Reason for consult: Follow-up assessment;Mother's request;Term;Other (Comment) (Gest HTN) Age:40 years  LC not able to access a latch infant fed 1 hr prior. Mom offered formula once. Mom wants to offer supplement own eBM. We got her pumping with pump n style increasing flange size 24 to 27.  Mom nipples are round with no signs of nipple trauma.  Mom aware to call for assistance if any discomfort with use of 27 flange.  Plan 1. To feed based on cues 8-12x 24 hr period. Mom to offer breast first and look for signs of milk transfer.  2. Mom to supplement with EBM first followed by formula. BF supplementation guide reviewed.  3. Mom to pump with DEBP q 3hrs for  All questions answered at the end of the visit.   Maternal Data Has patient been taught Hand Expression?: Yes  Feeding Mother's Current Feeding Choice: Breast Milk and Formula  LATCH Score                    Lactation Tools Discussed/Used Tools: Pump;Flanges Flange Size: 27 Breast pump type: Other (comment) (Mom supplemented once with formula at night. LC talked with Mom about offering her own EBM as an option. Mom pump n style we set up with Mom pumping to offer EBM via pace bottle feeding with yellow slow flow nipple.) Pump Education: Setup, frequency, and cleaning;Milk Storage Reason for Pumping: increase stimulation Pumping frequency: every 3 hrs for 15 min  Interventions Interventions: Breast feeding basics reviewed;Education;Pace feeding;Infant Driven Feeding Algorithm education;DEBP  Discharge Pump: Personal  Consult Status Consult Status: Follow-up Date: 09/08/21 Follow-up type: In-patient    Anne [name]  [name] 09/07/2021, 1:38 PM

## 2021-09-07 NOTE — Progress Notes (Signed)
SVD: primary  S:  Pt reports feeling better. No longer n/v/ Tolerating po/ Voiding without problems/ No n/v/ Bleeding is moderate/ Pain controlled withprescription NSAID's including ibuprofen (Motrin) and narcotic analgesics including oxycodone/acetaminophen (Percocet, Tylox)    O:  A & O x 3 / VS: Blood pressure (!) 77/50, pulse 65, temperature 97.6 F (36.4 C), temperature source Oral, resp. rate 16, height 5' 3.5" (1.613 m), weight 72.1 kg, SpO2 98 %, unknown if currently breastfeeding.  LABS:  Results for orders placed or performed during the hospital encounter of 09/05/21 (from the past 24 hour(s))  CBC     Status: Abnormal   Collection Time: 09/07/21  4:32 AM  Result Value Ref Range   WBC 26.7 (H) 4.0 - 10.5 K/uL   RBC 3.04 (L) 3.87 - 5.11 MIL/uL   Hemoglobin 9.7 (L) 12.0 - 15.0 g/dL   HCT 38.2 (L) 50.5 - 39.7 %   MCV 90.8 80.0 - 100.0 fL   MCH 31.9 26.0 - 34.0 pg   MCHC 35.1 30.0 - 36.0 g/dL   RDW 67.3 41.9 - 37.9 %   Platelets 177 150 - 400 K/uL   nRBC 0.0 0.0 - 0.2 %    I&O: I/O last 3 completed shifts: In: 2109 [I.V.:2000; IV Piggyback:109] Out: 6297 [Urine:5350; Blood:947]   Total I/O In: 250 [I.V.:250] Out: 500 [Urine:500]  Lungs: chest clear, no wheezing, rales, normal symmetric air entry, Heart exam - S1, S2 normal, no murmur, no gallop, rate regular  Heart: regular rate and rhythm, S1, S2 normal, no murmur, click, rub or gallop  Abdomen: soft (+) BS. Uterus firm tender 1FB below umb Primary dressing  stained on right honeycomb dressing  Perineum: not inspected  Lochia: mod  Extremities:no redness or tenderness in the calves or thighs, edema tr+    A/P: POD # 1/PPD # 1/ G1P1001 Presumed chorioamnionitis Tx with  A/G/C x 24 hrs. Hypotension with no evidence of tachycardia , voiding well. Prob residue effect of chorio. Pt asymptomatic  Doing well  Continue routine post partum orders  Cont to monitor vitals. Pt asking about discharge tomorrow And incisional  care

## 2021-09-07 NOTE — Progress Notes (Signed)
Patient states that N&V and dizziness has resolved. Patient was able to eat her soup and keep it down. Blood pressures are still trending low but patient feels better. Patient agrees to ambulation at 0300.

## 2021-09-07 NOTE — Progress Notes (Addendum)
CSW met with MOB to complete consult for mental health, and Edinburgh of 22. CSW observed MOB resting in bed, bonding with infant. CSW explained role, and reason for consult. MOB was pleasant, and polite during engagement with CSW. MOB reported, history of anxiety, and depression in 2016, when she was in college. MOB reported, history of psychotropic medications, and her last doses in 2016 (Zoloft, and Celexa). MOB reported, history of therapy on campus while in college at [employer]. MOB reported, during her pregnancy she was exteremly depressed. MOB reported, she did not have any support, and had to do everything on her own. In addition, to experiencing sickness, and having to work. MOB decline to the need of medication. CSW encourage MOB to implement healthy coping skills when symptoms arises.   CSW provided education regarding the baby blues period vs. perinatal mood disorders, discussed treatment and gave resources for mental health follow up if concerns arise. CSW recommends self- evaluation during the postpartum time period using the New Mom Checklist from Postpartum Progress and encouraged MOB to contact a medical professional if symptoms are noted at any time.   MOB reported, since delivery she feels, "normal". MOB reported, at times FOB, and her parents are supportive. MOB denied SI, HI, and DV when CSW assessed for safety.   MOB reported, there are no barriers to follow up infant's care. MOB reported, she has all essentials needed to care for infant. MOB reported, infant has a car seat, and bassinet. MOB denied any additional barriers.     CSW provided education on sudden infant death syndrome (SIDS).  CSW provided perinatal mood disorders resources.   CSW identifies no further need for intervention or barriers to discharge at this time.  Anne [name], MSW, LCSW-A Clinical Social Worker- Weekends [phone]  

## 2021-09-08 MED ORDER — HYDROMORPHONE HCL 2 MG PO TABS: 2.0000 mg | ORAL_TABLET | ORAL | 0 refills | Status: AC | PRN

## 2021-09-08 MED ORDER — SIMETHICONE 80 MG PO CHEW: 80.0000 mg | CHEWABLE_TABLET | Freq: Four times a day (QID) | ORAL | 5 refills | Status: AC | PRN

## 2021-09-08 MED ORDER — HYDROMORPHONE HCL 2 MG PO TABS
2.0000 mg | ORAL_TABLET | ORAL | Status: DC | PRN
  Administered 2021-09-08: 2 mg via ORAL
  Filled 2021-09-08 (×2): qty 1

## 2021-09-08 MED ORDER — IBUPROFEN 600 MG PO TABS
600.0000 mg | ORAL_TABLET | Freq: Four times a day (QID) | ORAL | 11 refills | Status: DC | PRN
Start: 2021-09-08 — End: 2024-02-18

## 2021-09-08 MED ORDER — PRENATAL MULTIVITAMIN CH: 1.0000 | ORAL_TABLET | Freq: Every day | ORAL | 3 refills | Status: AC

## 2021-09-08 NOTE — Discharge Summary (Signed)
Postpartum Discharge Summary  Date of Service updated     Patient Name: Anne [name] DOB: May 17, 1981 MRN: [id number]  Date of admission: 09/05/2021 Delivery date:09/06/2021  Delivering provider: Thoren Hosang  Date of discharge: 09/08/2021  Admitting diagnosis: Oligohydramnios antepartum, third trimester, fetus 1 [O41.03X1] Postpartum care following cesarean delivery [Z39.2] Intrauterine pregnancy: [redacted]w[redacted]d    Secondary diagnosis:  Active Problems:   Oligohydramnios antepartum, third trimester, fetus 1   Postpartum care following cesarean delivery  Additional problems: marginal cord insertion, variable decelerations    Discharge diagnosis: Term Pregnancy Delivered and presumed chorioamnionitis, arrest of dilation                                               Post partum procedures: IV antibiotics Augmentation: AROM and Pitocin Complications: Intrauterine Inflammation or infection (Chorioamniotis)  Hospital course: Induction of Labor With Cesarean Section   40y.o. yo G1P1001 at 466w0das admitted to the hospital 09/05/2021 for induction of labor. Patient had a labor course significant for dxof oligohydramnios, variable decelerations. AROM, thick meconium, IUPC/ISE placed. Amnioinfusion started due to variable deceleration. Pitocin started. Pt progressed to 6 cm . Repetitive variables occurred despite amnioinfusion. Pt refused C/S. Developed fever and fetal tachycardia. Agreed thereafter to C/S. The patient went for cesarean section due to Non-Reassuring FHR and presumed chorioamnionitis, arrest of dilation . Delivery details are as follows: Membrane Rupture Time/Date: 6:31 PM ,09/05/2021   Delivery Method:C-Section, Low Transverse  Details of operation can be found in separate operative Note.  Patient had an uncomplicated postpartum course. She is ambulating, tolerating a regular diet, passing flatus, and urinating well.  Patient is discharged home in stable condition on  09/08/2021      Newborn Data: Birth date:09/06/2021  Birth time:7:16 AM  Gender:Female  Living status:Living  Apgars:8 ,9  Weight:3.345 kg                                Magnesium Sulfate received: No BMZ received: No Rhophylac:N/A MMR:No T-DaP:Given prenatally Flu: No Transfusion:No  Physical exam  Vitals:   09/07/21 1436 09/07/21 2036 09/07/21 2100 09/08/21 0605  BP: (!) 85/50 (!) 92/56 94/60 (!) 78/58  Pulse: 62 68 76 62  Resp: _0 Temp: 98.6 F (37 C) 97.7 F (36.5 C) 97.7 F (36.5 C) 97.7 F (36.5 C)  TempSrc: Oral Oral Oral Oral  SpO2: 100% 100% 100% 98%  Weight:      Height:       General: alert, cooperative, and no distress Lochia: appropriate Uterine Fundus: firm Incision: Dressing is clean, dry, and intact DVT Evaluation: No evidence of DVT seen on physical exam. Calf/Ankle edema is present Labs: Lab Results  Component Value Date   WBC 26.7 (H) 09/07/2021   HGB 9.7 (L) 09/07/2021   HCT 27.6 (L) 09/07/2021   MCV 90.8 09/07/2021   PLT 177 09/07/2021   No flowsheet data found. Edinburgh Score: Edinburgh Postnatal Depression Scale Screening Tool 09/06/2021  I have been able to laugh and see the funny side of things. 3  I have looked forward with enjoyment to things. 1  I have blamed myself unnecessarily when things went wrong. 3  I have been anxious or worried for no good reason. 3  I have felt scared  or panicky for no good reason. 3  Things have been getting on top of me. 3  I have been so unhappy that I have had difficulty sleeping. 1  I have felt sad or miserable. 2  I have been so unhappy that I have been crying. 3  The thought of harming myself has occurred to me. 0  Edinburgh Postnatal Depression Scale Total 22      After visit meds:     Discharge home in stable condition Infant Feeding: Breast Infant Disposition:home with mother Discharge instruction: per After Visit Summary and Postpartum booklet. Activity: Advance as  tolerated. Pelvic rest for 6 weeks.  Diet: routine diet Anticipated Birth Control: Condoms Postpartum Appointment:6 weeks Additional Postpartum F/U:  n/a Future Appointments:No future appointments. Follow up Visit:  Follow-up Information     Servando Salina, MD Follow up in 6 week(s).   Specialty: Obstetrics and Gynecology Contact information: [address] [place] [address] [phone]                     09/08/2021 Marvene Staff, MD

## 2021-09-08 NOTE — Progress Notes (Signed)
Pt informed RN that she was having pain in her abdomen and stated "she hasn't been able to pass gas since surgery". RN assessed bowel sounds, audible/hypoactive in all quadrants. RN administered simethicone and encouraged pt to ambulate when pain is managed. RN will continue to monitor pt.   Herbert Moors, RN

## 2021-09-08 NOTE — Progress Notes (Signed)
SVD: primary  S:  Pt reports feeling well. Requesting early discharge.  Per NP for baby expect baby to be ok for discharge todayC/o gas pain / Tolerating po/ Voiding without problems/ No n/v/ Bleeding is light/ Pain controlled withprescription NSAID's including motrin and narcotic analgesics including hydromorphone (Dilaudid)    O:  A & O x 3 / VS: Blood pressure (!) 78/58, pulse 62, temperature 97.7 F (36.5 C), temperature source Oral, resp. rate 18, height 5' 3.5" (1.613 m), weight 72.1 kg, SpO2 98 %, unknown if currently breastfeeding.  LABS: No results found for this or any previous visit (from the past 24 hour(s)).  I&O: I/O last 3 completed shifts: In: 250 [I.V.:250] Out: 1600 [Urine:1600]   No intake/output data recorded.  Lungs: chest clear, no wheezing, rales, normal symmetric air entry  Heart: regular rate and rhythm, S1, S2 normal, no murmur, click, rub or gallop  Abdomen:  soft active BS uterus firm at umb. Primary dressing d/c/i  Perineum: not inspected  Lochia: moderate  Extremities:no redness or tenderness in the calves or thighs, edema tr+    A/P: POD # 2/PPD # 2/ [id number]  Doing well  Continue routine post partum orders  D/c instructions reviewed Instructed to remove honeycomb dressing in 3 days F/u 6 wk  Scripts sent including mylicon for gas

## 2021-09-08 NOTE — Lactation Note (Signed)
This note was copied from a baby's chart. Lactation Consultation Note  Patient Name: Anne [name] [id number] Date: 09/08/2021 Reason for consult: Follow-up assessment;Mother's request;Primapara;1st time breastfeeding;Term;Hyperbilirubinemia;Breastfeeding assistance Age:38 hours  Infant anterior attachment at times hold tongue back making latch painful/. With chin tug, able to extend tongue to relieve discomfort.   LC assisted with latching with breast compression to offer more volume with audible swallows noted.  Mom to supplement with EBM first followed by formula with pace bottle feeding with yellow slow flow nipple.  Mom using her DEBP q 3 hrs for 15 min.   All questions answered in midst of visit.   Maternal Data    Feeding Mother's Current Feeding Choice: Breast Milk and Formula Nipple Type: Slow - flow  LATCH Score Latch: Grasps breast easily, tongue down, lips flanged, rhythmical sucking.  Audible Swallowing: Spontaneous and intermittent  Type of Nipple: Everted at rest and after stimulation  Comfort (Breast/Nipple): Soft / non-tender  Hold (Positioning): Assistance needed to correctly position infant at breast and maintain latch.  LATCH Score: 9   Lactation Tools Discussed/Used Tools: Pump;Flanges Breast pump type: Other (comment) (Personal pump) Pump Education: Setup, frequency, and cleaning;Milk Storage Reason for Pumping: increase stimulation Pumping frequency: every 3 hrs for 15 min  Interventions Interventions: Breast feeding basics reviewed;Support pillows;Education;Assisted with latch;Position options;Skin to skin;Expressed milk;Breast massage;Hand express;DEBP;Breast compression;Adjust position;Pace feeding;Visual merchandiser education  Discharge    Consult Status Consult Status: Follow-up Date: 09/09/21 Follow-up type: In-patient    Anne Puello  [name] 09/08/2021, 4:33 PM

## 2021-09-08 NOTE — Discharge Instructions (Signed)
Call if temperature greater than equal to 100.4, nothing per vagina for 4-6 weeks or severe nausea vomiting, increased incisional pain , drainage or redness in the incision site, no straining with bowel movements, showers no bath °

## 2021-09-09 ENCOUNTER — Ambulatory Visit: Payer: Self-pay

## 2021-09-09 LAB — SURGICAL PATHOLOGY

## 2021-09-09 NOTE — Lactation Note (Signed)
This note was copied from a baby's chart. Lactation Consultation Note  Patient Name: Boy Krista Godsil ENIDP'O Date: 09/09/2021   Age:38 hours LC entered the room, family asleep at this time. Maternal Data    Feeding    LATCH Score Latch: Grasps breast easily, tongue down, lips flanged, rhythmical sucking.  Audible Swallowing: A few with stimulation  Type of Nipple: Everted at rest and after stimulation  Comfort (Breast/Nipple): Filling, red/small blisters or bruises, mild/mod discomfort  Hold (Positioning): Assistance needed to correctly position infant at breast and maintain latch.  LATCH Score: 7   Lactation Tools Discussed/Used    Interventions Interventions: Breast feeding basics reviewed;Assisted with latch;Skin to skin;Adjust position;Support pillows;Position options  Discharge    Consult Status      Danelle Earthly 09/09/2021, 1:41 AM

## 2021-09-09 NOTE — Lactation Note (Signed)
This note was copied from a baby's chart. Lactation Consultation Note  Patient Name: Boy Pheonix Wisby [id number] Date: 09/09/2021   Age:38 hours Follow up to see mother and family sleeping. Will follow up again later.  Maternal Data    Feeding    LATCH Score Latch: Repeated attempts needed to sustain latch, nipple held in mouth throughout feeding, stimulation needed to elicit sucking reflex.  Audible Swallowing: A few with stimulation  Type of Nipple: Everted at rest and after stimulation  Comfort (Breast/Nipple): Filling, red/small blisters or bruises, mild/mod discomfort  Hold (Positioning): Assistance needed to correctly position infant at breast and maintain latch.  LATCH Score: 6   Lactation Tools Discussed/Used    Interventions Interventions: Breast feeding basics reviewed;Assisted with latch;Breast massage;Breast compression;Adjust position;Position options;Support pillows  Discharge    Consult Status      Stevan Born [facility] 09/09/2021, 8:47 AM

## 2021-09-09 NOTE — Lactation Note (Signed)
This note was copied from a baby's chart. Lactation Consultation Note  Patient Name: Anne [name] Date: 09/09/2021 Reason for consult: Follow-up assessment Age:38 hours P1 Mother is breastfeeding and giving lots of formula. Mother has her own pump . She reports that she is only getting drops when she pumps. Assist mother with latching infant. Infant sustained latch for 20 mins on one breast and infant latched on the alternate breast.  Mother has bilateral positional strips on her nipples. Assist with deeper latch and adjusting infants top lip and chin tug.  Advised mother to pump after feedings and offer EBM instead of formula.  Mothers breast are filling full and firm. Discussed prevention and engorgement treatment.  Mother to continue to cue base feed and allow for cluster feeding.  Sent message for follow up appt to OP services to be seen.  Maternal Data    Feeding Mother's Current Feeding Choice: Breast Milk and Formula  LATCH Score Latch: Grasps breast easily, tongue down, lips flanged, rhythmical sucking.  Audible Swallowing: Spontaneous and intermittent  Type of Nipple: Everted at rest and after stimulation  Comfort (Breast/Nipple): Filling, red/small blisters or bruises, mild/mod discomfort  Hold (Positioning): Assistance needed to correctly position infant at breast and maintain latch.  LATCH Score: 8   Lactation Tools Discussed/Used    Interventions Interventions: Breast feeding basics reviewed;Assisted with latch;Skin to skin;Hand express;Adjust position;Support pillows;Position options;Education;Ice;Breast compression  Discharge Discharge Education: Engorgement and breast care;Warning signs for feeding baby;Outpatient recommendation;Outpatient Epic message sent  Consult Status Consult Status: Complete    Michel Bickers 09/09/2021, 11:25 AM

## 2021-09-19 ENCOUNTER — Telehealth (HOSPITAL_COMMUNITY): Payer: Self-pay

## 2021-09-19 ENCOUNTER — Other Ambulatory Visit: Payer: Self-pay | Admitting: Obstetrics and Gynecology

## 2021-09-19 ENCOUNTER — Other Ambulatory Visit (HOSPITAL_COMMUNITY): Payer: Self-pay | Admitting: Obstetrics and Gynecology

## 2021-09-19 DIAGNOSIS — R109 Unspecified abdominal pain: Secondary | ICD-10-CM

## 2021-09-19 DIAGNOSIS — R1011 Right upper quadrant pain: Secondary | ICD-10-CM

## 2021-09-19 NOTE — Telephone Encounter (Signed)
  No answer. Left message to return nurse call.  Hospital EPDS score on 09/06/21 was 22. Fax notification sent to Dr. Cherly Hensen.  [facility] 09/19/2021,1424

## 2021-09-20 ENCOUNTER — Ambulatory Visit (HOSPITAL_COMMUNITY)
Admission: RE | Admit: 2021-09-20 | Discharge: 2021-09-20 | Disposition: A | Discharge status: Home / Self Care | Payer: 59 | Source: Ambulatory Visit | Attending: Obstetrics and Gynecology | Admitting: Obstetrics and Gynecology

## 2021-09-20 ENCOUNTER — Other Ambulatory Visit: Payer: Self-pay

## 2021-09-20 DIAGNOSIS — R109 Unspecified abdominal pain: Secondary | ICD-10-CM | POA: Insufficient documentation

## 2022-06-29 IMAGING — US US ABDOMEN COMPLETE
1 series · 14 of 25 positions shown · non-contrast
Comparison: Sonogram dated [DATE] [DATE] [DATE]

CLINICAL DATA: Abdominal pain

EXAM:
ABDOMEN ULTRASOUND COMPLETE

[Series 1: us abdomen complete · 14 of 86 slices shown]
[im 1/86]
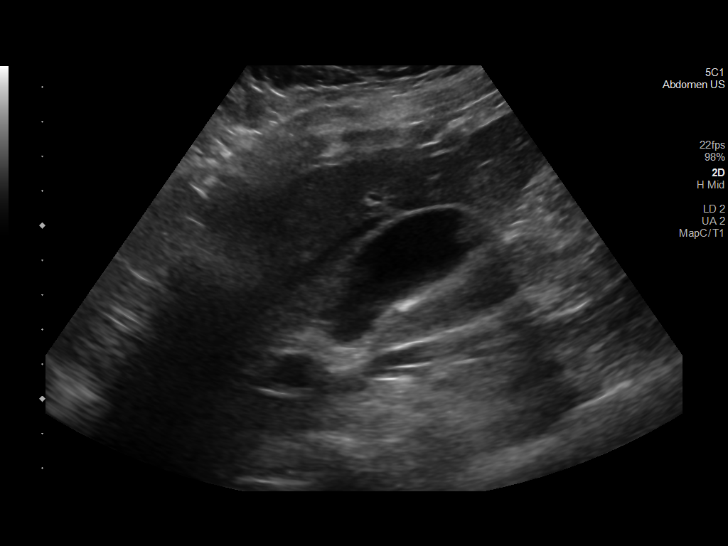
[im 8/86]
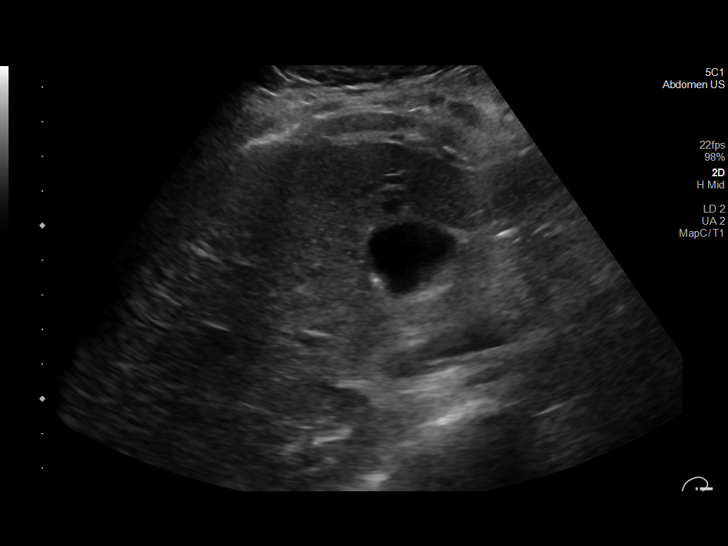
[im 15/86]
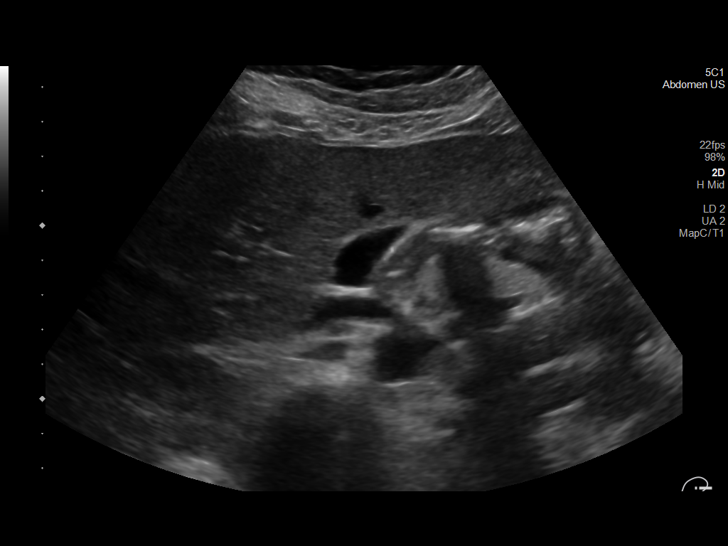
[im 22/86]
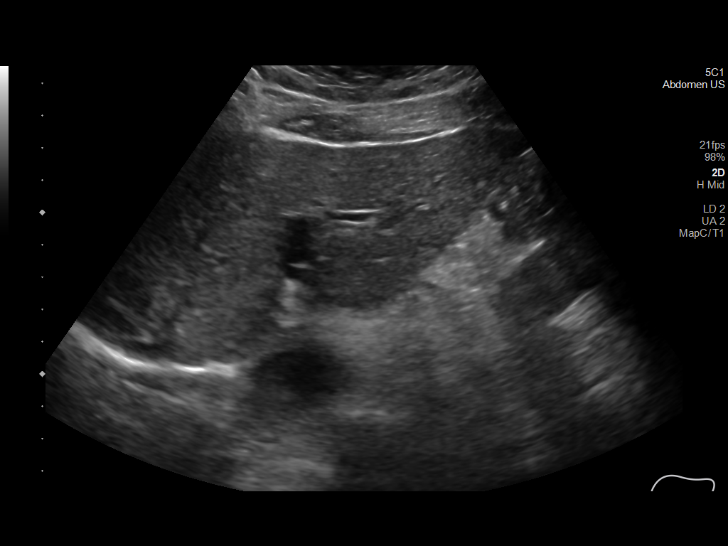
[im 29/86]
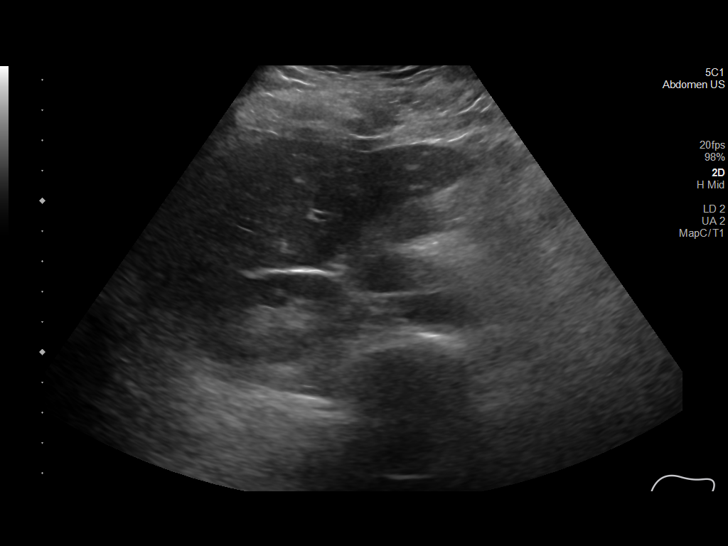
[im 32/86]
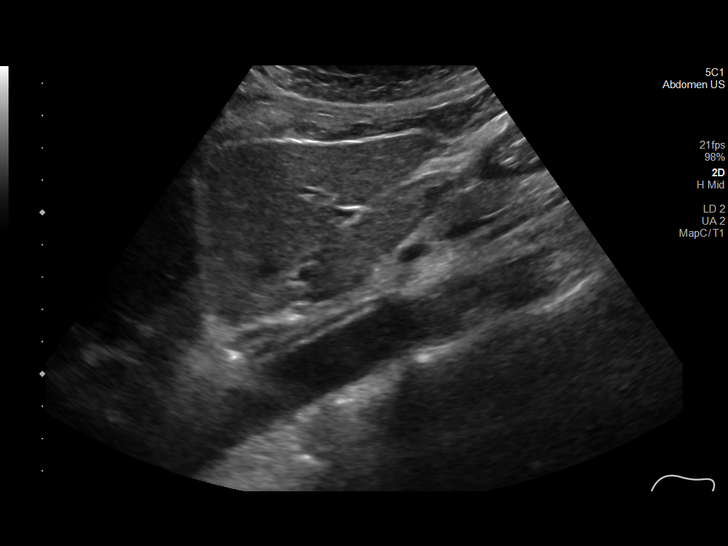
[im 39/86]
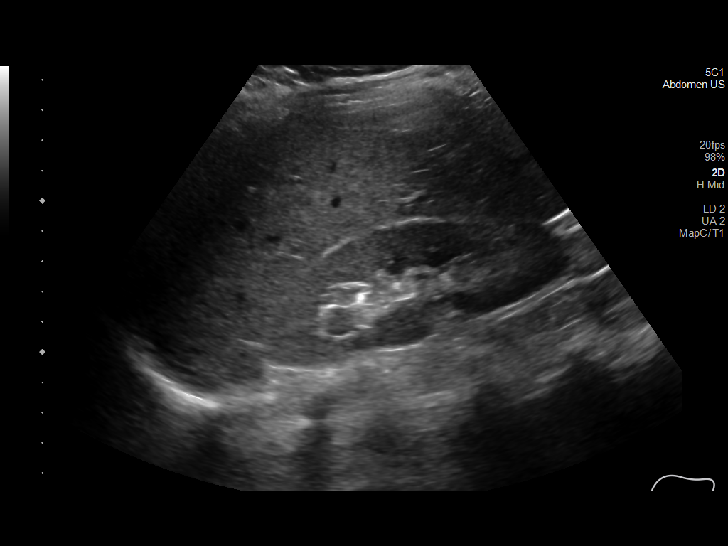
[im 47/86]
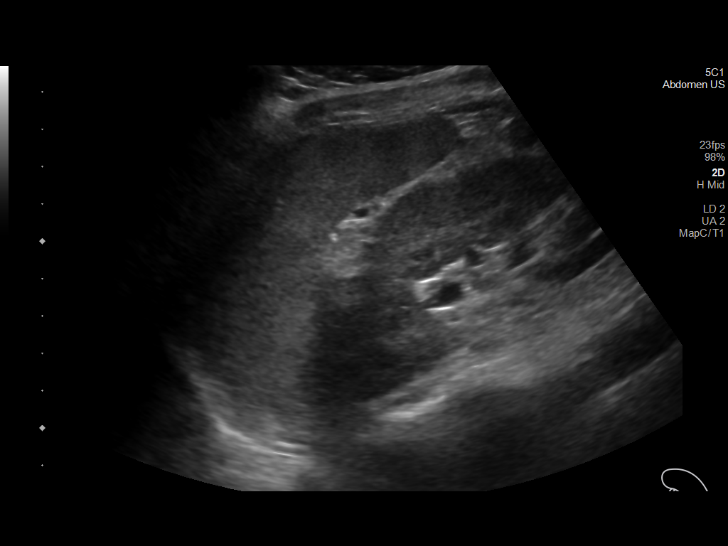
[im 54/86]
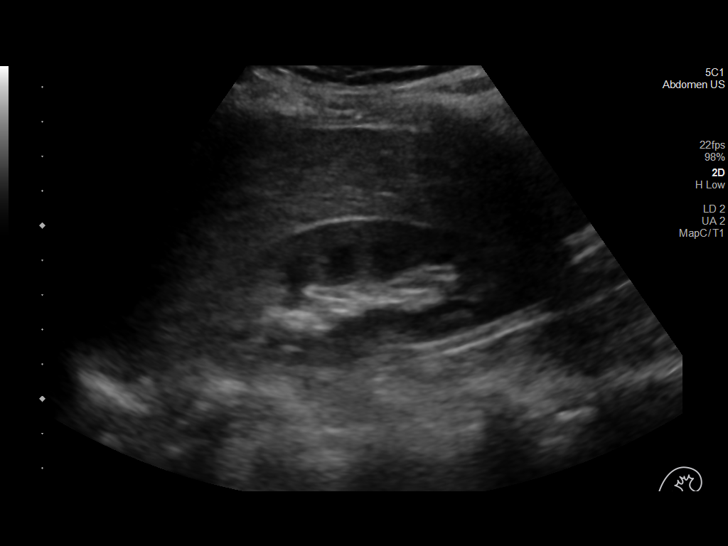
[im 57/86]
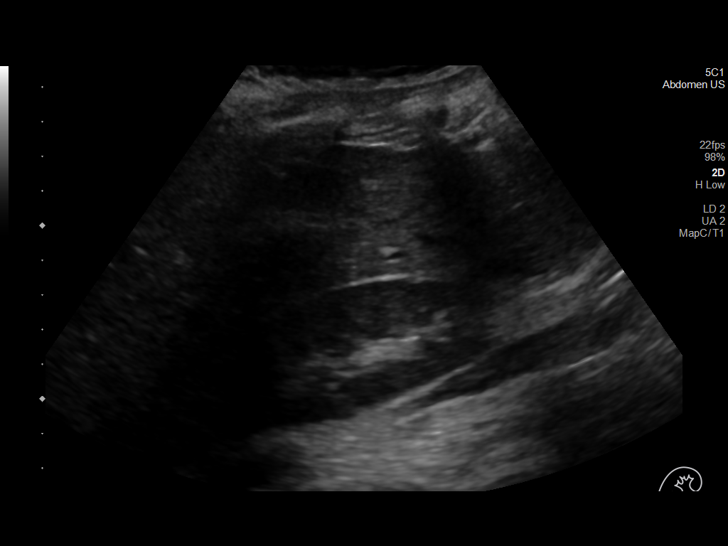
[im 64/86]
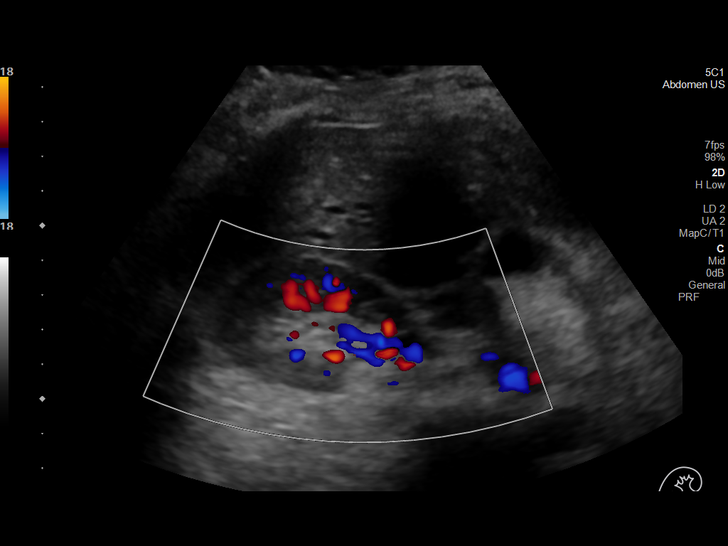
[im 71/86]
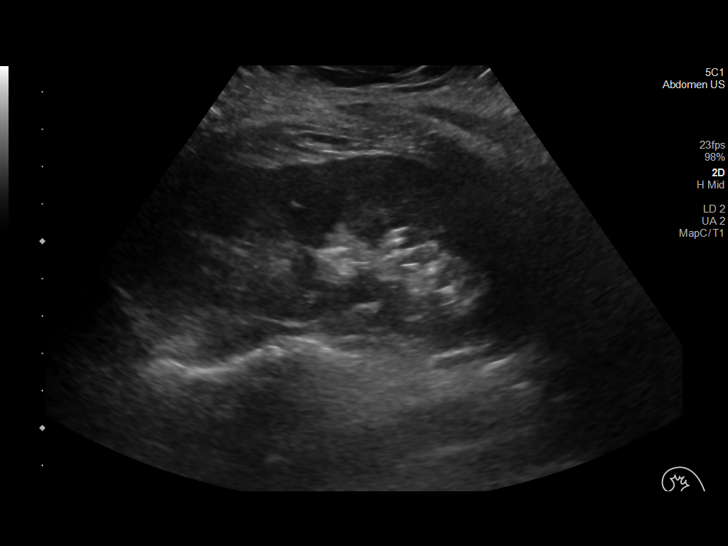
[im 78/86]
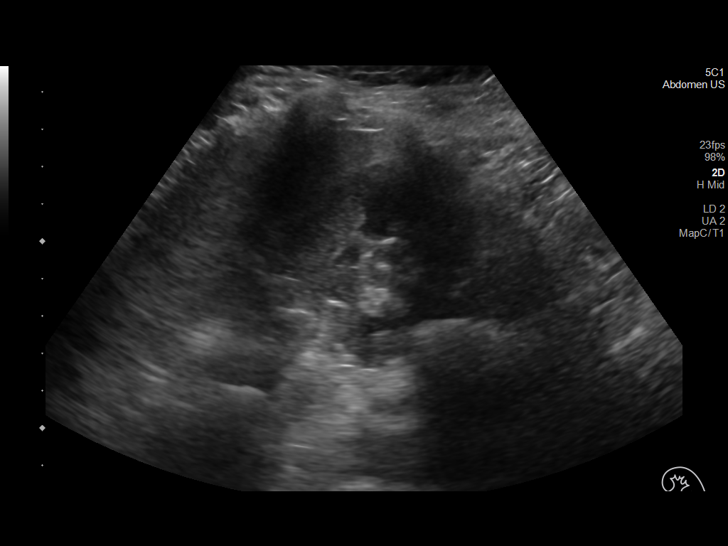
[im 86/86]
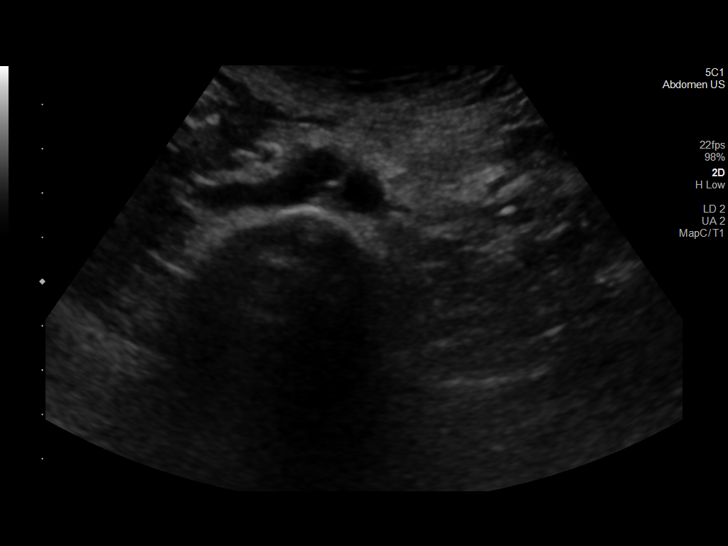

[14 of 25 positions shown; findings below may reference images not displayed]

FINDINGS: Gallbladder: No gallstones or wall thickening visualized. No
sonographic Murphy sign noted by sonographer.

Common bile duct: Diameter: 4 mm

Liver: No focal lesion identified. Hyperechoic parenchymal
echogenicity. Portal vein is patent on color Doppler imaging with
normal direction of blood flow towards the liver.

IVC: No abnormality visualized.

Pancreas: Visualized portion unremarkable.

Spleen: Size and appearance within normal limits.

Right Kidney: Length: 10.6 cm. Echogenicity within normal limits. No
mass or hydronephrosis visualized.

Left Kidney: Length: 11.1 cm. Echogenicity within normal limits. No
mass or hydronephrosis visualized.

Abdominal aorta: No aneurysm visualized.

Other findings: None.
IMPRESSION: Mild hepatic steatosis otherwise unremarkable examination.

## 2023-08-19 LAB — OB RESULTS CONSOLE RUBELLA ANTIBODY, IGM: Rubella: IMMUNE

## 2023-08-19 LAB — OB RESULTS CONSOLE HIV ANTIBODY (ROUTINE TESTING): HIV: NONREACTIVE

## 2023-08-19 LAB — OB RESULTS CONSOLE HEPATITIS B SURFACE ANTIGEN: Hepatitis B Surface Ag: NEGATIVE

## 2023-08-19 LAB — OB RESULTS CONSOLE GC/CHLAMYDIA
Chlamydia: NEGATIVE
Neisseria Gonorrhea: NEGATIVE

## 2023-08-19 LAB — OB RESULTS CONSOLE ABO/RH: RH Type: POSITIVE

## 2023-08-19 LAB — OB RESULTS CONSOLE VARICELLA ZOSTER ANTIBODY, IGG: Varicella: IMMUNE

## 2023-08-19 LAB — HEPATITIS C ANTIBODY: HCV Ab: NEGATIVE

## 2023-08-19 LAB — OB RESULTS CONSOLE ANTIBODY SCREEN: Antibody Screen: NEGATIVE

## 2023-08-19 LAB — OB RESULTS CONSOLE RPR: RPR: NONREACTIVE

## 2023-08-25 DIAGNOSIS — Z419 Encounter for procedure for purposes other than remedying health state, unspecified: Secondary | ICD-10-CM | POA: Diagnosis not present

## 2023-11-25 NOTE — L&D Delivery Note (Signed)
 Operative Delivery Note At 9:14 AM a viable female was delivered via VBAC, Vacuum Assisted.  Presentation: vertex; Position: Right,, Occiput,, Anterior; Station: +3.  Verbal consent: obtained from patient.  Risks and benefits discussed in detail.  Risks include, but are not limited to the risks of anesthesia, bleeding, infection, damage to maternal tissues, fetal cephalhematoma.  There is also the risk of inability to effect vaginal delivery of the head, or shoulder dystocia that cannot be resolved by established maneuvers, leading to the need for emergency cesarean section.  APGAR: 8 and 9    Placenta status:spontaneously with 3 vessel cord  Cord:  with the following complications: short cord    Anesthesia:  epidural Instruments: mushroom Episiotomy: None Lacerations: 2nd degree;Perineal Suture Repair: 3.0 chromic Est. Blood Loss (mL): 300  NO UTERINE WINDOW on exam   Mom to postpartum.  Baby to Couplet care / Skin to Skin.  Jeani Hawking 02/16/2024, 9:30 AM

## 2024-01-27 LAB — OB RESULTS CONSOLE GBS: GBS: NEGATIVE

## 2024-02-15 ENCOUNTER — Encounter (HOSPITAL_COMMUNITY): Payer: Self-pay

## 2024-02-15 ENCOUNTER — Inpatient Hospital Stay (HOSPITAL_COMMUNITY)
Admission: AD | Admit: 2024-02-15 | Discharge: 2024-02-18 | DRG: 807 | Disposition: A | Discharge status: Home / Self Care | Attending: Obstetrics and Gynecology | Admitting: Obstetrics and Gynecology

## 2024-02-15 DIAGNOSIS — Z87891 Personal history of nicotine dependence: Secondary | ICD-10-CM

## 2024-02-15 DIAGNOSIS — O34219 Maternal care for unspecified type scar from previous cesarean delivery: Principal | ICD-10-CM | POA: Diagnosis present

## 2024-02-15 DIAGNOSIS — O34211 Maternal care for low transverse scar from previous cesarean delivery: Principal | ICD-10-CM | POA: Diagnosis present

## 2024-02-15 DIAGNOSIS — Z3A39 39 weeks gestation of pregnancy: Secondary | ICD-10-CM

## 2024-02-15 NOTE — MAU Note (Signed)
 Anne [name] is a 43 y.o. at 106w0d here in MAU reporting: ctx that began at 1700. Pt states they are every 2 minutes. Pt denies VB or LOF. +FM   Onset of complaint: 1700 Pain score: 10/10 Vitals:   02/16/24 0001  BP: 105/65  Pulse: 82  Resp: 18  Temp: 97.8 F (36.6 C)  SpO2: 98%     FHT:144 Lab orders placed from triage:

## 2024-02-16 ENCOUNTER — Encounter (HOSPITAL_COMMUNITY): Payer: Self-pay | Admitting: Obstetrics & Gynecology

## 2024-02-16 ENCOUNTER — Other Ambulatory Visit: Payer: Self-pay

## 2024-02-16 ENCOUNTER — Inpatient Hospital Stay (HOSPITAL_COMMUNITY): Admitting: Anesthesiology

## 2024-02-16 DIAGNOSIS — O26893 Other specified pregnancy related conditions, third trimester: Secondary | ICD-10-CM | POA: Diagnosis present

## 2024-02-16 DIAGNOSIS — Z87891 Personal history of nicotine dependence: Secondary | ICD-10-CM | POA: Diagnosis not present

## 2024-02-16 DIAGNOSIS — O34219 Maternal care for unspecified type scar from previous cesarean delivery: Principal | ICD-10-CM | POA: Diagnosis present

## 2024-02-16 DIAGNOSIS — O34211 Maternal care for low transverse scar from previous cesarean delivery: Secondary | ICD-10-CM | POA: Diagnosis present

## 2024-02-16 DIAGNOSIS — Z3A39 39 weeks gestation of pregnancy: Secondary | ICD-10-CM | POA: Diagnosis not present

## 2024-02-16 LAB — CBC
HCT: 37.5 % (ref 36.0–46.0)
Hemoglobin: 12.9 g/dL (ref 12.0–15.0)
MCH: 31.5 pg (ref 26.0–34.0)
MCHC: 34.4 g/dL (ref 30.0–36.0)
MCV: 91.7 fL (ref 80.0–100.0)
Platelets: 177 10*3/uL (ref 150–400)
RBC: 4.09 MIL/uL (ref 3.87–5.11)
RDW: 13.4 % (ref 11.5–15.5)
WBC: 14.3 10*3/uL — ABNORMAL HIGH (ref 4.0–10.5)
nRBC: 0 % (ref 0.0–0.2)

## 2024-02-16 LAB — RPR: RPR Ser Ql: NONREACTIVE

## 2024-02-16 LAB — TYPE AND SCREEN
ABO/RH(D): O POS
Antibody Screen: NEGATIVE

## 2024-02-16 LAB — HIV ANTIBODY (ROUTINE TESTING W REFLEX): HIV Screen 4th Generation wRfx: NONREACTIVE

## 2024-02-16 MED ORDER — BISACODYL 10 MG RE SUPP: 10.0000 mg | Freq: Every day | RECTAL | Status: DC | PRN

## 2024-02-16 MED ORDER — ONDANSETRON HCL 4 MG/2ML IJ SOLN: 4.0000 mg | Freq: Four times a day (QID) | INTRAMUSCULAR | Status: DC | PRN

## 2024-02-16 MED ORDER — MEASLES, MUMPS & RUBELLA VAC IJ SOLR
0.5000 mL | Freq: Once | INTRAMUSCULAR | Status: DC
Start: 2024-02-17 — End: 2024-02-18

## 2024-02-16 MED ORDER — WITCH HAZEL-GLYCERIN EX PADS
1.0000 | MEDICATED_PAD | CUTANEOUS | Status: DC | PRN
  Administered 2024-02-18: 1 via TOPICAL

## 2024-02-16 MED ORDER — IBUPROFEN 600 MG PO TABS
600.0000 mg | ORAL_TABLET | Freq: Four times a day (QID) | ORAL | Status: DC
  Administered 2024-02-16 – 2024-02-18 (×6): 600 mg via ORAL
  Filled 2024-02-16 (×8): qty 1

## 2024-02-16 MED ORDER — ONDANSETRON HCL 4 MG/2ML IJ SOLN: 4.0000 mg | INTRAMUSCULAR | Status: DC | PRN

## 2024-02-16 MED ORDER — EPHEDRINE 5 MG/ML INJ: 10.0000 mg | INTRAVENOUS | Status: DC | PRN

## 2024-02-16 MED ORDER — OXYCODONE HCL 5 MG PO TABS
10.0000 mg | ORAL_TABLET | ORAL | Status: DC | PRN
  Administered 2024-02-17: 10 mg via ORAL

## 2024-02-16 MED ORDER — PRENATAL MULTIVITAMIN CH
1.0000 | ORAL_TABLET | Freq: Every day | ORAL | Status: DC
  Filled 2024-02-16 (×3): qty 1

## 2024-02-16 MED ORDER — COCONUT OIL OIL: 1.0000 | TOPICAL_OIL | Status: DC | PRN

## 2024-02-16 MED ORDER — FLEET ENEMA RE ENEM: 1.0000 | ENEMA | Freq: Every day | RECTAL | Status: DC | PRN

## 2024-02-16 MED ORDER — PHENYLEPHRINE 80 MCG/ML (10ML) SYRINGE FOR IV PUSH (FOR BLOOD PRESSURE SUPPORT): 80.0000 ug | PREFILLED_SYRINGE | INTRAVENOUS | Status: DC | PRN

## 2024-02-16 MED ORDER — OXYCODONE-ACETAMINOPHEN 5-325 MG PO TABS: 1.0000 | ORAL_TABLET | ORAL | Status: DC | PRN

## 2024-02-16 MED ORDER — LIDOCAINE HCL (PF) 1 % IJ SOLN: 30.0000 mL | INTRAMUSCULAR | Status: DC | PRN

## 2024-02-16 MED ORDER — MEDROXYPROGESTERONE ACETATE 150 MG/ML IM SUSP: 150.0000 mg | INTRAMUSCULAR | Status: DC | PRN

## 2024-02-16 MED ORDER — ONDANSETRON HCL 4 MG PO TABS: 4.0000 mg | ORAL_TABLET | ORAL | Status: DC | PRN

## 2024-02-16 MED ORDER — OXYCODONE HCL 5 MG PO TABS
5.0000 mg | ORAL_TABLET | ORAL | Status: DC | PRN
  Administered 2024-02-17: 5 mg via ORAL
  Filled 2024-02-16 (×4): qty 1

## 2024-02-16 MED ORDER — LACTATED RINGERS IV SOLN: 500.0000 mL | Freq: Once | INTRAVENOUS | Status: DC

## 2024-02-16 MED ORDER — FENTANYL CITRATE (PF) 100 MCG/2ML IJ SOLN
50.0000 ug | INTRAMUSCULAR | Status: DC | PRN
  Administered 2024-02-16 (×2): 100 ug via INTRAVENOUS
  Filled 2024-02-16 (×3): qty 2

## 2024-02-16 MED ORDER — OXYTOCIN BOLUS FROM INFUSION: 333.0000 mL | Freq: Once | INTRAVENOUS | Status: DC

## 2024-02-16 MED ORDER — FENTANYL-BUPIVACAINE-NACL 0.5-0.125-0.9 MG/250ML-% EP SOLN
12.0000 mL/h | EPIDURAL | Status: DC | PRN
  Administered 2024-02-16: 12 mL/h via EPIDURAL
  Filled 2024-02-16: qty 250

## 2024-02-16 MED ORDER — SENNOSIDES-DOCUSATE SODIUM 8.6-50 MG PO TABS
2.0000 | ORAL_TABLET | Freq: Every day | ORAL | Status: DC
  Administered 2024-02-17 – 2024-02-18 (×2): 2 via ORAL
  Filled 2024-02-16 (×2): qty 2

## 2024-02-16 MED ORDER — DIPHENHYDRAMINE HCL 50 MG/ML IJ SOLN: 12.5000 mg | INTRAMUSCULAR | Status: DC | PRN

## 2024-02-16 MED ORDER — OXYCODONE-ACETAMINOPHEN 5-325 MG PO TABS: 2.0000 | ORAL_TABLET | ORAL | Status: DC | PRN

## 2024-02-16 MED ORDER — SIMETHICONE 80 MG PO CHEW: 80.0000 mg | CHEWABLE_TABLET | ORAL | Status: DC | PRN

## 2024-02-16 MED ORDER — ZOLPIDEM TARTRATE 5 MG PO TABS: 5.0000 mg | ORAL_TABLET | Freq: Every evening | ORAL | Status: DC | PRN

## 2024-02-16 MED ORDER — LACTATED RINGERS IV SOLN: 500.0000 mL | INTRAVENOUS | Status: DC | PRN

## 2024-02-16 MED ORDER — OXYTOCIN-SODIUM CHLORIDE 30-0.9 UT/500ML-% IV SOLN: 2.5000 [IU]/h | INTRAVENOUS | Status: DC

## 2024-02-16 MED ORDER — ACETAMINOPHEN 325 MG PO TABS
650.0000 mg | ORAL_TABLET | ORAL | Status: DC | PRN
  Administered 2024-02-16: 650 mg via ORAL
  Filled 2024-02-16 (×2): qty 2

## 2024-02-16 MED ORDER — LACTATED RINGERS IV SOLN: INTRAVENOUS | Status: DC

## 2024-02-16 MED ORDER — DIPHENHYDRAMINE HCL 25 MG PO CAPS: 25.0000 mg | ORAL_CAPSULE | Freq: Four times a day (QID) | ORAL | Status: DC | PRN

## 2024-02-16 MED ORDER — SOD CITRATE-CITRIC ACID 500-334 MG/5ML PO SOLN: 30.0000 mL | ORAL | Status: DC | PRN

## 2024-02-16 MED ORDER — ACETAMINOPHEN 325 MG PO TABS: 650.0000 mg | ORAL_TABLET | ORAL | Status: DC | PRN

## 2024-02-16 MED ORDER — TETANUS-DIPHTH-ACELL PERTUSSIS 5-2.5-18.5 LF-MCG/0.5 IM SUSY: 0.5000 mL | PREFILLED_SYRINGE | Freq: Once | INTRAMUSCULAR | Status: DC

## 2024-02-16 MED ORDER — LIDOCAINE HCL (PF) 1 % IJ SOLN
INTRAMUSCULAR | Status: DC | PRN
  Administered 2024-02-16: 8 mL via EPIDURAL

## 2024-02-16 MED ORDER — BENZOCAINE-MENTHOL 20-0.5 % EX AERO
1.0000 | INHALATION_SPRAY | CUTANEOUS | Status: DC | PRN
  Administered 2024-02-18: 1 via TOPICAL
  Filled 2024-02-16 (×2): qty 56

## 2024-02-16 MED ORDER — DIBUCAINE (PERIANAL) 1 % EX OINT: 1.0000 | TOPICAL_OINTMENT | CUTANEOUS | Status: DC | PRN

## 2024-02-16 NOTE — Anesthesia Procedure Notes (Signed)
 Epidural Patient location during procedure: OB Start time: 02/16/2024 3:11 AM End time: 02/16/2024 3:19 AM  Staffing Anesthesiologist: Lannie Fields, DO Performed: anesthesiologist   Preanesthetic Checklist Completed: patient identified, IV checked, risks and benefits discussed, monitors and equipment checked, pre-op evaluation and timeout performed  Epidural Patient position: sitting Prep: DuraPrep and site prepped and draped Patient monitoring: continuous pulse ox, blood pressure, heart rate and cardiac monitor Approach: midline Location: L3-L4 Injection technique: LOR air  Needle:  Needle type: Tuohy  Needle gauge: 17 G Needle length: 9 cm Needle insertion depth: 5 cm Catheter type: closed end flexible Catheter size: 19 Gauge Catheter at skin depth: 10 cm Test dose: negative  Assessment Sensory level: T8 Events: blood not aspirated, no cerebrospinal fluid, injection not painful, no injection resistance, no paresthesia and negative IV test  Additional Notes Patient identified. Risks/Benefits/Options discussed with patient including but not limited to bleeding, infection, nerve damage, paralysis, failed block, incomplete pain control, headache, blood pressure changes, nausea, vomiting, reactions to medication both or allergic, itching and postpartum back pain. Confirmed with bedside nurse the patient's most recent platelet count. Confirmed with patient that they are not currently taking any anticoagulation, have any bleeding history or any family history of bleeding disorders. Patient expressed understanding and wished to proceed. All questions were answered. Sterile technique was used throughout the entire procedure. Please see nursing notes for vital signs. Test dose was given through epidural catheter and negative prior to continuing to dose epidural or start infusion. Warning signs of high block given to the patient including shortness of breath, tingling/numbness in  hands, complete motor block, or any concerning symptoms with instructions to call for help. Patient was given instructions on fall risk and not to get out of bed. All questions and concerns addressed with instructions to call with any issues or inadequate analgesia.  Reason for block:procedure for pain

## 2024-02-16 NOTE — Progress Notes (Signed)
 Patient is comfortable with epidural  Cervix is 90% 7.5 -2 Vertex forebag - AROM trace meconium Follow labor curve closely

## 2024-02-16 NOTE — Lactation Note (Signed)
 This note was copied from a baby's chart. Lactation Consultation Note  Patient Name: Anne [name] [id number] Date: 02/16/2024 Age:41 hours   Per RN, Mother declined Lactation assistance while at [facility].   Anne [name] 02/16/2024, 1:21 PM

## 2024-02-16 NOTE — Anesthesia Preprocedure Evaluation (Signed)
 Anesthesia Evaluation  Patient identified by MRN, date of birth, ID band Patient awake    Reviewed: Allergy & Precautions, Patient's Chart, lab work & pertinent test results  Airway Mallampati: II  TM Distance: >3 FB Neck ROM: Full    Dental no notable dental hx.    Pulmonary neg pulmonary ROS, former smoker   Pulmonary exam normal breath sounds clear to auscultation       Cardiovascular negative cardio ROS Normal cardiovascular exam Rhythm:Regular Rate:Normal     Neuro/Psych  PSYCHIATRIC DISORDERS Anxiety Depression    negative neurological ROS     GI/Hepatic negative GI ROS, Neg liver ROS,,,  Endo/Other  negative endocrine ROS    Renal/GU negative Renal ROS  negative genitourinary   Musculoskeletal negative musculoskeletal ROS (+)    Abdominal   Peds negative pediatric ROS (+)  Hematology negative hematology ROS (+) Hb 12.9, plt 177   Anesthesia Other Findings   Reproductive/Obstetrics (+) Pregnancy TOLAC s/p c section 2022                             Anesthesia Physical Anesthesia Plan  ASA: 2  Anesthesia Plan: Epidural   Post-op Pain Management:    Induction:   PONV Risk Score and Plan: 2  Airway Management Planned: Natural Airway  Additional Equipment: None  Intra-op Plan:   Post-operative Plan:   Informed Consent: I have reviewed the patients History and Physical, chart, labs and discussed the procedure including the risks, benefits and alternatives for the proposed anesthesia with the patient or authorized representative who has indicated his/her understanding and acceptance.       Plan Discussed with:   Anesthesia Plan Comments:        Anesthesia Quick Evaluation

## 2024-02-16 NOTE — MAU Note (Signed)

## 2024-02-16 NOTE — H&P (Signed)
 Anne [name] is a 43 y.o. female G2P1001 at [redacted]w[redacted]d presenting for TOLAC.  CTX started around 5 pm last night.  IV fentanyl no longer helping; requesting CLEA.  No VB or LOF.  Active FM.  Antepartum course complicated by h/o C/S for fetal intolerance to labor; CVX 6 cm.  AMA-low risk NIPT.  Negative GBS.  OB History     Gravida  2   Para  1   Term  1   Preterm      AB      Living  1      SAB      IAB      Ectopic      Multiple  0   Live Births  1          Past Medical History:  Diagnosis Date   Allergy    Anxiety    Depression    Past Surgical History:  Procedure Laterality Date   CESAREAN SECTION N/A 09/06/2021   Procedure: CESAREAN SECTION;  Surgeon: Maxie Better, MD;  Location: [facility] LD ORS;  Service: Obstetrics;  Laterality: N/A;   COSMETIC SURGERY     Family History: family history includes Cancer in her maternal grandmother; Diabetes in her mother. Social History:  reports that she has quit smoking. She has never used smokeless tobacco. She reports that she does not drink alcohol and does not use drugs.     Maternal Diabetes: No Genetic Screening: Normal Maternal Ultrasounds/Referrals: Normal Fetal Ultrasounds or other Referrals:  None Maternal Substance Abuse:  No Significant Maternal Medications:  None Significant Maternal Lab Results:  Group B Strep negative Number of Prenatal Visits:greater than 3 verified prenatal visits Maternal Vaccinations:none Other Comments:  None  Review of Systems Maternal Medical History:  Reason for admission: Contractions.   Contractions: Onset was 6-12 hours ago.   Frequency: regular.   Perceived severity is moderate.   Fetal activity: Perceived fetal activity is normal.   Last perceived fetal movement was within the past hour.   Prenatal complications: no prenatal complications Prenatal Complications - Diabetes: none.   Dilation: 3 Effacement (%): 80 Station: -2 Exam by:: Felipa Furnace,  RN Blood pressure 114/72, pulse 76, temperature 98 F (36.7 C), temperature source Oral, resp. rate 18, SpO2 98%, unknown if currently breastfeeding. Maternal Exam:  Uterine Assessment: Contraction strength is moderate.  Contraction frequency is regular.  Abdomen: Patient reports no abdominal tenderness. Surgical scars: low transverse.   Fundal height is c/w dates.   Estimated fetal weight is 7#4.     Fetal Exam Fetal Monitor Review: Baseline rate: 130.  Variability: moderate (6-25 bpm).   Pattern: no decelerations and accelerations present.   Fetal State Assessment: Category I - tracings are normal.   Physical Exam Constitutional:      Appearance: Normal appearance.  HENT:     Head: Normocephalic and atraumatic.  Pulmonary:     Effort: Pulmonary effort is normal.  Abdominal:     Palpations: Abdomen is soft.  Musculoskeletal:        General: Normal range of motion.     Cervical back: Normal range of motion.  Skin:    General: Skin is warm and dry.  Neurological:     Mental Status: She is alert and oriented to person, place, and time.  Psychiatric:        Mood and Affect: Mood normal.        Behavior: Behavior normal.     Prenatal labs: ABO, Rh: --/--/O  POS (03/25 0018) Antibody: NEG (03/25 0018) Rubella:  Immune RPR:   NR HBsAg:   Negative HIV: Non Reactive (03/25 0018)  GBS:   Negative  Assessment/Plan: 43 yo G2P1001 at [redacted]w[redacted]d with labor/TOLAC -Patient declines repeat C/S and is aware of risk of fetal distress, uterine rupture, hemorrhage, etc.  Proceed with TOLAC -Proceed with CLEA -Augment with AROM or pitocin prn -Anticipate VBAC    Mitchel Honour 02/16/2024, 3:06 AM

## 2024-02-16 NOTE — Anesthesia Postprocedure Evaluation (Signed)
 Anesthesia Post Note  Patient: Anne [name]  Procedure(s) Performed: AN AD HOC LABOR EPIDURAL     Patient location during evaluation: Mother Baby Anesthesia Type: Epidural Level of consciousness: awake and alert Pain management: pain level controlled Vital Signs Assessment: post-procedure vital signs reviewed and stable Respiratory status: spontaneous breathing, nonlabored ventilation and respiratory function stable Cardiovascular status: stable Postop Assessment: no headache, no backache and epidural receding Anesthetic complications: no   No notable events documented.  Last Vitals:  Vitals:   02/16/24 1056 02/16/24 1208  BP: 98/64 (!) 97/58  Pulse: 70 89  Resp: 16 16  Temp: 36.5 C 36.7 C  SpO2: 100% 100%    Last Pain:  Vitals:   02/16/24 1400  TempSrc:   PainSc: 0-No pain   Pain Goal:                   Vernona Peake

## 2024-02-17 ENCOUNTER — Encounter (HOSPITAL_COMMUNITY): Payer: Self-pay | Admitting: Obstetrics and Gynecology

## 2024-02-17 LAB — CBC
HCT: 29.9 % — ABNORMAL LOW (ref 36.0–46.0)
Hemoglobin: 10.3 g/dL — ABNORMAL LOW (ref 12.0–15.0)
MCH: 31.5 pg (ref 26.0–34.0)
MCHC: 34.4 g/dL (ref 30.0–36.0)
MCV: 91.4 fL (ref 80.0–100.0)
Platelets: 174 10*3/uL (ref 150–400)
RBC: 3.27 MIL/uL — ABNORMAL LOW (ref 3.87–5.11)
RDW: 13.8 % (ref 11.5–15.5)
WBC: 18.2 10*3/uL — ABNORMAL HIGH (ref 4.0–10.5)
nRBC: 0 % (ref 0.0–0.2)

## 2024-02-17 NOTE — Progress Notes (Signed)
 Postpartum Progress Note  S: No complaints. Feeling well. Lochia appropriate. No subjective fevers/chills.   O:     02/17/2024    5:20 AM 02/17/2024   12:56 AM 02/16/2024   10:15 PM  Vitals with BMI  Systolic 99 84 83  Diastolic 62 52 52  Pulse 64 67    Gen: NAD, A&O Pulm: NWOB Abd: soft, appropriately ttp, fundus firm and below Umb Ext: No evidence of DVT, trace edema b/l  Labs Recent Results (from the past 2160 hours)  OB RESULT CONSOLE Group B Strep     Status: None   Collection Time: 01/27/24 12:00 AM  Result Value Ref Range   GBS Negative   HIV Antibody (routine testing w rflx)     Status: None   Collection Time: 02/16/24 12:18 AM  Result Value Ref Range   HIV Screen 4th Generation wRfx Non Reactive Non Reactive    Comment: Performed at [facility], [address] [place], [place] [address]  CBC     Status: Abnormal   Collection Time: 02/16/24 12:18 AM  Result Value Ref Range   WBC 14.3 (H) 4.0 - 10.5 K/uL   RBC 4.09 3.87 - 5.11 MIL/uL   Hemoglobin 12.9 12.0 - 15.0 g/dL   HCT 60.4 54.0 - 98.1 %   MCV 91.7 80.0 - 100.0 fL   MCH 31.5 26.0 - 34.0 pg   MCHC 34.4 30.0 - 36.0 g/dL   RDW 19.1 47.8 - 29.5 %   Platelets 177 150 - 400 K/uL   nRBC 0.0 0.0 - 0.2 %    Comment: Performed at [facility], [address] [place], [place] [address]  Type and screen [facility]     Status: None   Collection Time: 02/16/24 12:18 AM  Result Value Ref Range   ABO/RH(D) O POS    Antibody Screen NEG    Sample Expiration      02/19/2024,2359 Performed at [facility], [address], [place], [place] [address]   RPR     Status: None   Collection Time: 02/16/24 12:18 AM  Result Value Ref Range   RPR Ser Ql NON REACTIVE NON REACTIVE    Comment: Performed at [facility], [address], [place], [place] [address]  CBC     Status: Abnormal   Collection Time: 02/17/24  5:50 AM  Result Value Ref Range   WBC 18.2 (H) 4.0 - 10.5 K/uL   RBC  3.27 (L) 3.87 - 5.11 MIL/uL   Hemoglobin 10.3 (L) 12.0 - 15.0 g/dL   HCT 95.2 (L) 84.1 - 32.4 %   MCV 91.4 80.0 - 100.0 fL   MCH 31.5 26.0 - 34.0 pg   MCHC 34.4 30.0 - 36.0 g/dL   RDW 40.1 02.7 - 25.3 %   Platelets 174 150 - 400 K/uL   nRBC 0.0 0.0 - 0.2 %    Comment: Performed at [facility], [address], [place], [place] [address]     A/P:  PPD1 s/p VBAC, doing well pp. AFVSS. Benign exam. Some perineal soreness. Continue present care. Plan for discharge PPD2.  Jule Economy, MD

## 2024-02-17 NOTE — Social Work (Signed)
 CSW received consult for hx of Anxiety and Postpartum and an [place] Postnatal Depression screen score of 18.  CSW met with MOB to offer support and complete assessment.  CSW entered the room and observed MOB in bed feeding the infant and FOB at bedside. CSW introduced self , CSW role and reason for visit, MOB was agreeable to visit an allowed FOB to remain in the room. CSW inquired about how MOB was feeling MOB reported good CSW inquired about MOB's MH hx, MOB reported she was diagnosed with anxiety and depression years ago. 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 MOB described experiencing sadness, restlessness, and anxiety. MOB reported her symptoms continued over a year. MOB reported during pregnancy she felt depressed. MOB inquired about he mood over the past 7 days due to her EPDS, MOB verbalized the same feelings, sad, depressed, and angry. CSW inquired about treatment, MOB reported she sees a therapist but no medication currently because she plans to breastfeed. CSW notified MOB there a medications that are safe for breastfeeding moms and suggested MOB speak with her OB regarding her options, MOB verbalized understanding. CSW also explored other alternatives such as taking walks, getting fresh air and journaling to cope with MH symptoms, MOB was agreeable. CSW assessed for safety, MOB denied any SI or HI. CSW provided education regarding the baby blues period vs. perinatal mood disorders, discussed treatment and gave resources for mental health follow up if concerns arise.  CSW recommends self-evaluation during the postpartum time period using the New Mom Checklist from Postpartum Progress and encouraged MOB to contact a medical professional if symptoms are noted at any time. MOB identified her mom, dad and therapist as her supports.  CSW provided review of Sudden Infant Death Syndrome (SIDS) precautions.  MOB identified [facility] for infants follow up care. MOB reported they have all  necessary items for the infant including a bassinet and car seat.  CSW identifies no further need for intervention and no barriers to discharge at this time.  Wende Neighbors, LCSWA Clinical Social Worker [phone]

## 2024-02-18 MED ORDER — IBUPROFEN 600 MG PO TABS: 600.0000 mg | ORAL_TABLET | Freq: Four times a day (QID) | ORAL | 0 refills | Status: AC

## 2024-02-18 NOTE — Discharge Summary (Signed)
 Postpartum Discharge Summary  Date of Service updated 02/18/24     Patient Name: Anne [name] DOB: Jun 21, 1981 MRN: [id number]  Date of admission: 02/15/2024 Delivery date:02/16/2024 Delivering provider: Marcelle Overlie Date of discharge: 02/18/2024  Admitting diagnosis: Desires VBAC (vaginal birth after cesarean) trial [O34.219] Vacuum extractor delivery, delivered [O75.9] Intrauterine pregnancy: [redacted]w[redacted]d     Secondary diagnosis:  Principal Problem:   Desires VBAC (vaginal birth after cesarean) trial Active Problems:   Vacuum extractor delivery, delivered  Additional problems: none    Discharge diagnosis: Term Pregnancy Delivered and VBAC                                              Post partum procedures: None Augmentation: AROM Complications: None  Hospital course: Onset of Labor With Vaginal Delivery      43 y.o. yo [id number] at [redacted]w[redacted]d was admitted in Active Labor on 02/15/2024. Labor course was complicated by none  Membrane Rupture Time/Date: 7:42 AM,02/16/2024  Delivery Method:VBAC, Vacuum Assisted Operative Delivery:Device used:muchroom Indication: Fetal indications Episiotomy: None Lacerations:  2nd degree;Perineal Patient had a postpartum course complicated by none.  She is ambulating, tolerating a regular diet, passing flatus, and urinating well. Patient is discharged home in stable condition on 02/18/24.  Newborn Data: Birth date:02/16/2024 Birth time:9:14 AM Gender:Female Living status:Living Apgars:8 ,9  Weight:3260 g  Magnesium Sulfate received: No BMZ received: No Rhophylac:N/A MMR:N/A T-DaP:Given prenatally  Physical exam  Vitals:   02/17/24 1448 02/17/24 1551 02/17/24 2300 02/18/24 0600  BP: (!) 91/47 (!) 90/55 (!) 92/56 (!) 88/54  Pulse: 64 66 78 70  Resp: 16  18 18   Temp: 97.7 F (36.5 C)  98.4 F (36.9 C) (!) 97.4 F (36.3 C)  TempSrc: Oral  Oral Oral  SpO2: 98%  99% 99%  Weight:      Height:       General: alert and  cooperative Lochia: appropriate Uterine Fundus: firm Incision: Healing well with no significant drainage DVT Evaluation: No evidence of DVT seen on physical exam. Labs: Lab Results  Component Value Date   WBC 18.2 (H) 02/17/2024   HGB 10.3 (L) 02/17/2024   HCT 29.9 (L) 02/17/2024   MCV 91.4 02/17/2024   PLT 174 02/17/2024       No data to display         Edinburgh Score:    02/16/2024    4:34 PM  Edinburgh Postnatal Depression Scale Screening Tool  I have been able to laugh and see the funny side of things. 1  I have looked forward with enjoyment to things. 0  I have blamed myself unnecessarily when things went wrong. 2  I have been anxious or worried for no good reason. 3  I have felt scared or panicky for no good reason. 2  Things have been getting on top of me. 2  I have been so unhappy that I have had difficulty sleeping. 2  I have felt sad or miserable. 3  I have been so unhappy that I have been crying. 3  The thought of harming myself has occurred to me. 0  Edinburgh Postnatal Depression Scale Total 18      After visit meds:  Allergies as of 02/18/2024   No Known Allergies      Medication List     TAKE these medications    ibuprofen  600 MG tablet Commonly known as: ADVIL Take 1 tablet (600 mg total) by mouth every 6 (six) hours. What changed:  when to take this reasons to take this   prenatal multivitamin Tabs tablet Take 1 tablet by mouth daily at 12 noon.   simethicone 80 MG chewable tablet Commonly known as: MYLICON Chew 1 tablet (80 mg total) by mouth 4 (four) times daily as needed for flatulence.         Discharge home in stable condition Infant Feeding: Bottle and Breast Infant Disposition:home with mother Discharge instruction: per After Visit Summary and Postpartum booklet. Activity: Advance as tolerated. Pelvic rest for 6 weeks.  Diet: routine diet Anticipated Birth Control: Unsure Postpartum Appointment:6 weeks Additional  Postpartum F/U:  None Future Appointments:No future appointments. Follow up Visit:      02/18/2024 Ranae Pila, MD

## 2024-02-18 NOTE — Progress Notes (Signed)
 SW saw pt for elevated EPDS and this am, patient continues to decline intervention. No SI/HI. Pt is sure about this choice. Has good support and declines intervention.    Rosie Fate MD

## 2024-02-29 ENCOUNTER — Telehealth (HOSPITAL_COMMUNITY): Payer: Self-pay | Admitting: *Deleted

## 2024-02-29 NOTE — Telephone Encounter (Signed)
 02/29/2024  Name: Anne [name] MRN: [id number] DOB: 1981/03/08  Reason for Call:  Transition of Care Hospital Discharge Call  Contact Status: Patient Contact Status: Complete  Language assistant needed:          Follow-Up Questions: Do You Have Any Concerns About Your Health As You Heal From Delivery?: No Do You Have Any Concerns About Your Infants Health?: No  Edinburgh Postnatal Depression Scale:  In the Past 7 Days:   Patient declined EPDS at this time. Stated, "Now is not a good time because I'm driving." Patient requested a return call later this evening. PHQ2-9 Depression Scale:     Discharge Follow-up: Edinburgh score requires follow up?: N/A Patient was advised of the following resources:: Breastfeeding Support Group, Support Group  Post-discharge interventions: Reviewed Newborn Safe Sleep Practices  Signature Deforest Hoyles, RN, 02/29/24, [phone]

## 2024-02-29 NOTE — Telephone Encounter (Signed)
 02/29/2024  Name: Anne [name] MRN: [id number] DOB: June 20, 1981  Reason for Call:  Transition of Care Hospital Discharge Call  Contact Status: Patient Contact Status: Message  Language assistant needed:          Follow-Up Questions:    Inocente Salles Postnatal Depression Scale:  In the Past 7 Days:     PHQ2-9 Depression Scale:     Discharge Follow-up:  Returned call to complete EPDS. No Answer; message left.   Post-discharge interventions: NA  Maudry Diego, RN 02/29/2024 1930

## 2024-05-21 ENCOUNTER — Ambulatory Visit
Admission: EM | Admit: 2024-05-21 | Discharge: 2024-05-21 | Disposition: A | Discharge status: Home / Self Care | Attending: Physician Assistant | Admitting: Physician Assistant

## 2024-05-21 ENCOUNTER — Other Ambulatory Visit: Payer: Self-pay

## 2024-05-21 DIAGNOSIS — R2230 Localized swelling, mass and lump, unspecified upper limb: Secondary | ICD-10-CM | POA: Insufficient documentation

## 2024-05-21 DIAGNOSIS — N949 Unspecified condition associated with female genital organs and menstrual cycle: Secondary | ICD-10-CM

## 2024-05-21 DIAGNOSIS — R519 Headache, unspecified: Secondary | ICD-10-CM | POA: Diagnosis not present

## 2024-05-21 DIAGNOSIS — R112 Nausea with vomiting, unspecified: Secondary | ICD-10-CM | POA: Diagnosis present

## 2024-05-21 NOTE — ED Provider Notes (Signed)
 [facility]    CSN: [id number] Arrival date & time: 05/21/24  1236      History   Chief Complaint Chief Complaint  Patient presents with   Vaginal Problem     HPI Anne [name] is a 43 y.o. female.   HPI  Pt reports that she is concerned for vaginal texture changes She states her period started last Sat  She states when she inserts her finger into vagina she feels lumps and ripples and the texture feels uneven She states this does not feel normal for her  She gave birth 3 months ago via vaginal delivery  She reports she did have a 2nd degree tear that needed sutures  She reports she still feels like her vaginal opening is open and wide  She has not followed up with OB/Gyn since after birth apt   She has taken tylenol  already for her headache and reports associated nausea with the headache  Pt began vomiting during exam - she declines zofran  injection to assist with nausea due to breastfeeding     Past Medical History:  Diagnosis Date   Allergy    Anxiety    Depression     Patient Active Problem List   Diagnosis Date Noted   Desires VBAC (vaginal birth after cesarean) trial 02/16/2024   Vacuum extractor delivery, delivered 02/16/2024   Postpartum care following cesarean delivery 09/06/2021   Oligohydramnios antepartum, third trimester, fetus 1 09/05/2021    Past Surgical History:  Procedure Laterality Date   CESAREAN SECTION N/A 09/06/2021   Procedure: CESAREAN SECTION;  Surgeon: Rutherford Gain, MD;  Location: [facility] LD [facility];  Service: Obstetrics;  Laterality: N/A;   COSMETIC SURGERY      OB History     Gravida  2   Para  2   Term  2   Preterm      AB      Living  2      SAB      IAB      Ectopic      Multiple  0   Live Births  2            Home Medications    Prior to Admission medications   Medication Sig Start Date End Date Taking? Authorizing Provider  hydrOXYzine (VISTARIL) 25 MG capsule SMARTSIG:1-2  Capsule(s) By Mouth 1-2 Times Daily PRN 05/17/24  Yes [provider]  nortriptyline (PAMELOR) 10 MG capsule Take by mouth. 05/17/24  Yes [provider]  ibuprofen  (ADVIL ) 600 MG tablet Take 1 tablet (600 mg total) by mouth every 6 (six) hours. 02/18/24   Marne Kelly Nest, MD  Prenatal Vit-Fe Fumarate-FA (PRENATAL MULTIVITAMIN) TABS tablet Take 1 tablet by mouth daily at 12 noon. 09/08/21   Rutherford Gain, MD  simethicone  (MYLICON) 80 MG chewable tablet Chew 1 tablet (80 mg total) by mouth 4 (four) times daily as needed for flatulence. 09/08/21   Rutherford Gain, MD    Family History Family History  Problem Relation Age of Onset   Diabetes Mother    Cancer Maternal Grandmother     Social History Social History   Tobacco Use   Smoking status: Former   Smokeless tobacco: Never  Advertising account planner   Vaping status: Never Used  Substance Use Topics   Alcohol use: No   Drug use: No     Allergies   Latex   Review of Systems Review of Systems  Constitutional:  Negative for chills and fever.  Gastrointestinal:  Positive for nausea and vomiting.  Genitourinary:  Negative for difficulty urinating, dysuria, genital sores, menstrual problem, vaginal bleeding, vaginal discharge and vaginal pain.  Skin:  Negative for rash.  Neurological:  Positive for headaches (current headache).     Physical Exam Triage Vital Signs ED Triage Vitals  Encounter Vitals Group     BP 05/21/24 1353 102/62     Girls Systolic BP Percentile --      Girls Diastolic BP Percentile --      Boys Systolic BP Percentile --      Boys Diastolic BP Percentile --      Pulse Rate 05/21/24 1353 (!) 42     Resp 05/21/24 1353 18     Temp 05/21/24 1353 (!) 97.5 F (36.4 C)     Temp Source 05/21/24 1353 Oral     SpO2 05/21/24 1353 98 %     Weight 05/21/24 1355 135 lb (61.2 kg)     Height 05/21/24 1355 5' 3 (1.6 m)     Head Circumference --      Peak Flow --      Pain Score 05/21/24 1355 0      Pain Loc --      Pain Education --      Exclude from Growth Chart --    No data found.  Updated Vital Signs BP 102/62 (BP Location: Right Arm)   Pulse (!) 42 Comment: pt is unsure of typical baseline. states she recently started antidepressants. Analiza Cowger PA will be made aware.  Temp (!) 97.5 F (36.4 C) (Oral)   Resp 18   Ht 5' 3 (1.6 m)   Wt 135 lb (61.2 kg)   LMP 05/14/2024 (Exact Date)   SpO2 98%   Breastfeeding Yes   BMI 23.91 kg/m   Visual Acuity Right Eye Distance:   Left Eye Distance:   Bilateral Distance:    Right Eye Near:   Left Eye Near:    Bilateral Near:     Physical Exam Vitals reviewed. Exam conducted with a chaperone present.  Constitutional:      General: She is awake. She is not in acute distress.    Appearance: Normal appearance. She is well-developed and well-groomed. She is ill-appearing. She is not toxic-appearing.     Comments: Pt is actively vomiting during initial interview   HENT:     Head: Normocephalic and atraumatic.   Cardiovascular:     Rate and Rhythm: Normal rate and regular rhythm.     Pulses: Normal pulses.          Radial pulses are 2+ on the right side and 2+ on the left side.     Heart sounds: Normal heart sounds. No murmur heard.    No friction rub. No gallop.  Pulmonary:     Effort: Pulmonary effort is normal.     Breath sounds: Normal breath sounds. No decreased air movement. No decreased breath sounds, wheezing, rhonchi or rales.  Genitourinary:    General: Normal vulva.     Labia:        Right: No rash, tenderness, lesion or injury.        Left: No rash, tenderness, lesion or injury.      Vagina: No signs of injury and foreign body. Vaginal discharge and tenderness present. No erythema, bleeding or lesions.     Comments: Chaperoned by Rosina Sharps, RN   Pelvic exam is overall normal with the exception of scant white discharge.  There  is some evidence of potential vaginal granulation tissue along the vaginal wall but  there is also evidence of typical rugae.  No obvious signs of ulcerations, papulovesicular lesions, fistula at this time.  Musculoskeletal:     Cervical back: Normal range of motion.     Right lower leg: No edema.     Left lower leg: No edema.   Neurological:     General: No focal deficit present.     Mental Status: She is alert and oriented to person, place, and time. Mental status is at baseline.     GCS: GCS eye subscore is 4. GCS verbal subscore is 5. GCS motor subscore is 6.   Psychiatric:        Attention and Perception: Attention and perception normal.        Mood and Affect: Mood and affect normal.        Speech: Speech normal.        Behavior: Behavior normal. Behavior is cooperative.        Thought Content: Thought content normal.        Cognition and Memory: Cognition normal.      UC Treatments / Results  Labs (all labs ordered are listed, but only abnormal results are displayed) Labs Reviewed  CERVICOVAGINAL ANCILLARY ONLY    EKG   Radiology No results found.  Procedures Procedures (including critical care time)  Medications Ordered in UC Medications - No data to display  Initial Impression / Assessment and Plan / UC Course  I have reviewed the triage vital signs and the nursing notes.  Pertinent labs & imaging results that were available during my care of the patient were reviewed by me and considered in my medical decision making (see chart for details).      Final Clinical Impressions(s) / UC Diagnoses   Final diagnoses:  Vaginal discomfort   Patient presents today with concerns for vaginal lesions.  She states that she can feel lesions and reports in the vaginal space and It is atypical for her.  Of note patient recently gave birth via vaginal delivery about 3 months ago.  Patient is requesting pelvic exam today which was performed with chaperone.  Physical exam does show scant white discharge in the vagina but I do not see obvious signs of  concerning lesions.  I reviewed with patient that her findings may be consistent with vaginal granulation tissue or vaginal changes related to recovery from vaginal birth.  Will collect cervicovaginal swab to assess for BV, trichomoniasis, yeast, gonorrhea, chlamydia.  Recommend that if patient is still having concerns she follows up with OB/GYN for further evaluation and potential management as needed.     Discharge Instructions      You were seen today for concerns of vaginal wall changes following vaginal delivery. At this time I do not see any obvious signs of concerning lesions or injuries. We have collected a cervicovaginal swab to rule out gonorrhea, chlamydia, yeast, BV and trichomonas. We will keep you updated with these results once they are available. If you continue to have worrisome symptoms, please follow up with your OB/GYN for further evaluation.      ED Prescriptions   None    PDMP not reviewed this encounter.   Marylene Rocky BRAVO, PA-C 05/21/24 1714

## 2024-05-21 NOTE — ED Triage Notes (Addendum)
 Pt presents to urgent care today with vaginal problem. Pt states when I insert my finger into vagina, I feel all of these bumps. I gave natural birth three months ago. It was just not like that before. Pt noticed these symptoms yesterday, 6/27. Currently denies pain, just very concerned. Requesting STD testing.

## 2024-05-21 NOTE — Discharge Instructions (Addendum)
 You were seen today for concerns of vaginal wall changes following vaginal delivery. At this time I do not see any obvious signs of concerning lesions or injuries. We have collected a cervicovaginal swab to rule out gonorrhea, chlamydia, yeast, BV and trichomonas. We will keep you updated with these results once they are available. If you continue to have worrisome symptoms, please follow up with your OB/GYN for further evaluation.

## 2024-05-24 ENCOUNTER — Ambulatory Visit (HOSPITAL_COMMUNITY): Payer: Self-pay

## 2024-05-24 LAB — CERVICOVAGINAL ANCILLARY ONLY
Bacterial Vaginitis (gardnerella): POSITIVE — AB
Candida Glabrata: NEGATIVE
Candida Vaginitis: NEGATIVE
Chlamydia: NEGATIVE
Comment: NEGATIVE
Comment: NEGATIVE
Comment: NEGATIVE
Comment: NEGATIVE
Comment: NEGATIVE
Comment: NORMAL
Neisseria Gonorrhea: NEGATIVE
Trichomonas: NEGATIVE

## 2024-05-24 NOTE — Telephone Encounter (Signed)
 Agree that pt's concerns were not consistent with presence of BV. Treatment therefore not indicated.

## 2024-05-29 MED ORDER — METRONIDAZOLE 0.75 % VA GEL: 1.0000 | Freq: Every day | VAGINAL | 0 refills | Status: AC

## 2024-09-15 ENCOUNTER — Other Ambulatory Visit: Payer: Self-pay

## 2024-09-15 ENCOUNTER — Ambulatory Visit: Attending: Obstetrics and Gynecology

## 2024-09-15 DIAGNOSIS — N393 Stress incontinence (female) (male): Secondary | ICD-10-CM | POA: Diagnosis present

## 2024-09-15 DIAGNOSIS — R279 Unspecified lack of coordination: Secondary | ICD-10-CM | POA: Diagnosis present

## 2024-09-15 DIAGNOSIS — R102 Pelvic and perineal pain unspecified side: Secondary | ICD-10-CM | POA: Insufficient documentation

## 2024-09-15 DIAGNOSIS — M6281 Muscle weakness (generalized): Secondary | ICD-10-CM | POA: Diagnosis present

## 2024-09-15 NOTE — Therapy (Signed)
 OUTPATIENT PHYSICAL THERAPY FEMALE PELVIC EVALUATION   Patient Name: Anne [name] MRN: [id number] DOB:01/27/1981, 43 y.o., female Today's Date: 09/15/2024  END OF SESSION:  PT End of Session - 09/15/24 1456     Visit Number 1    Date for Recertification  03/02/25    Authorization Type [facility] Medicaid    Authorization Time Period auth after 11/1    PT Start Time 1455    PT Stop Time 1530    PT Time Calculation (min) 35 min    Activity Tolerance Patient tolerated treatment well    Behavior During Therapy [facility] for tasks assessed/performed          Past Medical History:  Diagnosis Date   Allergy    Anxiety    Depression    Past Surgical History:  Procedure Laterality Date   CESAREAN SECTION N/A 09/06/2021   Procedure: CESAREAN SECTION;  Surgeon: Rutherford Gain, MD;  Location: [facility] LD ORS;  Service: Obstetrics;  Laterality: N/A;   COSMETIC SURGERY     Patient Active Problem List   Diagnosis Date Noted   Desires VBAC (vaginal birth after cesarean) trial 02/16/2024   Vacuum extractor delivery, delivered 02/16/2024   Postpartum care following cesarean delivery 09/06/2021   Oligohydramnios antepartum, third trimester, fetus 1 09/05/2021    PCP: NA  REFERRING PROVIDER: Mat Browning, MD   REFERRING DIAG: R10.2 (ICD-10-CM) - Pelvic and perineal pain  THERAPY DIAG:  Muscle weakness (generalized)  Unspecified lack of coordination  Pelvic pain  Stress incontinence (female) (female)  Rationale for Evaluation and Treatment: Rehabilitation  ONSET DATE: 02/16/24   SUBJECTIVE:                                                                                                                                                                                           SUBJECTIVE STATEMENT: Pt states that she has been having difficulty with urinary incontinence since vaginal delivery in March. She had c-section 3 years ago.    PAIN:  Are you having pain?  No   PRECAUTIONS: None  RED FLAGS: None   WEIGHT BEARING RESTRICTIONS: No  FALLS:  Has patient fallen in last 6 months? No  OCCUPATION: sitting job  ACTIVITY LEVEL : no exercise  PLOF: Independent  PATIENT GOALS: decrease leaking, correct how vaginal opening looks  PERTINENT HISTORY:  C-section 09/06/2021, anxiety, depression,  Sexual abuse: No  BOWEL MOVEMENT: Pain with bowel movement: No Type of bowel movement:Type (Bristol Stool Scale) 4, Frequency every other day, and Strain no Fully empty rectum: No Leakage: No Pads: Yes: see below Fiber supplement/laxative No  URINATION: Pain with urination: No  Fully empty bladder: Yes:   Stream: Strong Urgency: No Frequency: every 2 hours Fluid Intake: 2-3 cups a day Leakage: Coughing, Sneezing, Laughing, and vomiting Pads: Yes: sometimes  INTERCOURSE:  Ability to have vaginal penetration Yes  Pain with intercourse: none DrynessNo Climax: WNL Marinoff Scale: 0/3 Lubricant:  PREGNANCY: Vaginal deliveries 1 Tearing Yes: 2nd degree tear Episiotomy No C-section deliveries 1 Currently pregnant No  PROLAPSE: None   OBJECTIVE:  Note: Objective measures were completed at Evaluation unless otherwise noted.  09/15/24 PATIENT SURVEYS:   PFIQ-7: 57  COGNITION: Overall cognitive status: Within functional limits for tasks assessed     SENSATION: Light touch: Appears intact   FUNCTIONAL TESTS:  Squat: weakness, unable to control down Single leg stance:  Rt: pelvic drop  Lt: pelvic drop Curl-up test: no distortion Sit-up test: 2/3 with abdominal distortion   GAIT: Assistive device utilized: None Comments: WNL  POSTURE: rounded shoulders, forward head, and anterior pelvic tilt   LUMBARAROM/PROM: WNL    PALPATION:   General: WNL  Pelvic Alignment: anterior tilt  Abdominal: minimal scar tissue restriction from c-section                External Perineal Exam: perineal scar tissue that is  making some perineal tissue bulge                             Internal Pelvic Floor: tenderness at perineal scar tissue; poor coordination of pelvic floor muscle contraction  Patient confirms identification and approves PT to assess internal pelvic floor and treatment Yes  PELVIC MMT:   MMT eval  Vaginal 2/5, 5 seconds, 7 repeat contractions  Diastasis Recti 3 finger width separation  (Blank rows = not tested)        TONE: WNL  PROLAPSE: Unable to tell due to poor coordination with bearing down  TODAY'S TREATMENT:                                                                                                                              DATE:  09/15/24 EVAL  Neuromuscular re-education: Pt provides verbal consent for internal vaginal/rectal pelvic floor exam. Internal vaginal pelvic floor muscle contraction training Quick flicks Long holds Bridge Therapeutic activities: Pt education on perineal scar tissue mobilization  For all possible CPT codes, reference the Planned Interventions line above.     Check all conditions that are expected to impact treatment: {Conditions expected to impact treatment:None of these apply   If treatment provided at initial evaluation, no treatment charged due to lack of authorization.       PATIENT EDUCATION:  Education details: See above Person educated: Patient Education method: Explanation, Demonstration, Tactile cues, Verbal cues, and Handouts Education comprehension: verbalized understanding  HOME EXERCISE PROGRAM: [id number]  ASSESSMENT:  CLINICAL IMPRESSION: Patient is a 43 y.o. female who was seen today for physical therapy evaluation and treatment for urinary incontinence and  perineal scar tissue restriction following vaginal delivery March 2025. Exam findings notable for abnormal posture, core weakness with pelvic drop in standing, diastasis recti abdominus with distortion in sit-up test, minimal c-section scar tissue  restriction, perineal scar tissue restriction that is tender, and pelvic floor muscle weakness. Signs and symptoms are most consistent with perineal scar tissue restriction and core and pelvic floor muscle weakness. Pt is most concerned about how her vaginal opening looks; we talked extensively about normal healing time frame for vaginal connective tissue being at least a year. She will likely see improvement in this as she continues to naturally heal, she gains pelvic floor muscle strength, and improves perineal scar tissue mobility. Believe the tissue she is concerned about is more prominent because is scar tissue restriction and as we release this she will feel better. Initial treatment consisted of education on perineal scar tissue restriction and pelvic floor muscle strengthening. She will continue to benefit from skilled PT intervention in order to decrease pelvic pain, address impairments, and improve quality of life.   OBJECTIVE IMPAIRMENTS: decreased activity tolerance, decreased coordination, decreased endurance, decreased mobility, decreased ROM, decreased strength, increased fascial restrictions, increased muscle spasms, impaired flexibility, impaired tone, improper body mechanics, postural dysfunction, and pain.   ACTIVITY LIMITATIONS: continence  PARTICIPATION LIMITATIONS: interpersonal relationship, community activity, occupation, and exercise  PERSONAL FACTORS: 3+ comorbidities: medical history are also affecting patient's functional outcome.   REHAB POTENTIAL: Good  CLINICAL DECISION MAKING: Stable/uncomplicated  EVALUATION COMPLEXITY: Low   GOALS: Goals reviewed with patient? Yes  SHORT TERM GOALS: Target date: 10/13/2024   Pt will be independent with HEP in order to improve activity tolerance.   Baseline: Goal status: INITIAL  2. Pt will be independent with self-perineal scar tissue mobilization and perform 3-4x/week in order to decrease sensitivity and allow tissue to  lie more naturally.   Baseline: tenderness, restriction, and tissue puckering Goal status: INITIAL  3.  Pt will be independent with use of the knack in order to decrease stress urinary incontinence to improve personal hygiene.   Baseline: not using Goal status: INITIAL   LONG TERM GOALS: Target date: 03/02/25  Pt will be independent with advanced HEP in order to improve activity tolerance.   Baseline:  Goal status: INITIAL  2.  Pt will report no leaks with laughing, coughing, sneezing in order to improve comfort with interpersonal relationships and community activities.   Baseline: leaking Goal status: INITIAL  3.  Pt will demonstrate normal pelvic floor muscle tone and A/ROM, able to achieve 3/5 strength with contractions and 10 sec endurance, in order to reduce urinary leaking and number of pads patient wears.   Baseline: has to wear pad most days, strength 2/5, endurance 5 seconds Goal status: INITIAL  4.  Patient will be able to perform single leg stance with good pelvic stability in order to demonstrate appropriate pelvic floor muscle and transversus abdominus strength and coordination in order to decrease urinary incontinence.   Baseline: bil pelvic drop Goal status: INITIAL  5.  Pt will be able to perform 3/3 sit-up test without abdominal distortion in order to prevent vaginal wall laxity and low back pain.  Baseline: 2/3 with distortion Goal status: INITIAL  PLAN:  PT FREQUENCY: 1-2x/week  PT DURATION: 8 visits    PLANNED INTERVENTIONS: [id number]- PT Re-evaluation, 97110-Therapeutic exercises, 97530- Therapeutic activity, [id number]- Neuromuscular re-education, 97535- Self Care, [id number]- Manual therapy, [id number]- Gait training, [phone]- Aquatic Therapy, [id number]- Electrical stimulation (unattended), [id number]- Traction (mechanical), [id number]- Ionotophoresis 4mg /ml Dexamethasone ,  79439 (1-2 muscles), 20561 (3+ muscles)- Dry Needling, Patient/Family education, Balance training, Taping, Joint  mobilization, Joint manipulation, Spinal manipulation, Spinal mobilization, Scar mobilization, Vestibular training, Cryotherapy, Moist heat, and Biofeedback  PLAN FOR NEXT SESSION: pt education on the knack; core strengthening; perineal scar tissue mobilization; return to pelvic floor muscle contraction training due to poor coordination   Josette Mares, PT, DPT10/23/253:54 PM [facility] [address], [address] [place], [place] [address] Phone # [phone] Fax [phone]

## 2024-09-15 NOTE — Patient Instructions (Signed)
 Massage scar tissue in vaginal opening with coconut oil 3-4x/week for 1-2 minutes.    [facility] [address], [address] [place], [place] [address] Phone # [phone] Fax [phone]

## 2024-11-01 ENCOUNTER — Ambulatory Visit: Attending: Obstetrics and Gynecology | Admitting: Physical Therapy

## 2024-11-01 ENCOUNTER — Encounter: Payer: Self-pay | Admitting: Physical Therapy

## 2024-11-01 DIAGNOSIS — N393 Stress incontinence (female) (male): Secondary | ICD-10-CM | POA: Diagnosis present

## 2024-11-01 DIAGNOSIS — R279 Unspecified lack of coordination: Secondary | ICD-10-CM | POA: Diagnosis present

## 2024-11-01 DIAGNOSIS — R102 Pelvic and perineal pain unspecified side: Secondary | ICD-10-CM | POA: Diagnosis present

## 2024-11-01 DIAGNOSIS — M6281 Muscle weakness (generalized): Secondary | ICD-10-CM | POA: Insufficient documentation

## 2024-11-01 NOTE — Therapy (Signed)
 OUTPATIENT PHYSICAL THERAPY FEMALE PELVIC EVALUATION   Patient Name: Anne [name] MRN: [id number] DOB:1981/06/18, 43 y.o., female Today's Date: 11/01/2024  END OF SESSION:  PT End of Session - 11/01/24 1632     Visit Number 2    Date for Recertification  03/02/25    Authorization Type [facility] Medicaid    Authorization Time Period Submitted dino 11/01/24-[id number]    PT Start Time 1620    PT Stop Time 1708    PT Time Calculation (min) 48 min    Activity Tolerance Patient tolerated treatment well    Behavior During Therapy [facility] for tasks assessed/performed           Past Medical History:  Diagnosis Date   Allergy    Anxiety    Depression    Past Surgical History:  Procedure Laterality Date   CESAREAN SECTION N/A 09/06/2021   Procedure: CESAREAN SECTION;  Surgeon: Rutherford Gain, MD;  Location: [facility] LD ORS;  Service: Obstetrics;  Laterality: N/A;   COSMETIC SURGERY     Patient Active Problem List   Diagnosis Date Noted   Desires VBAC (vaginal birth after cesarean) trial 02/16/2024   Vacuum extractor delivery, delivered 02/16/2024   Postpartum care following cesarean delivery 09/06/2021   Oligohydramnios antepartum, third trimester, fetus 1 09/05/2021    PCP: NA  REFERRING PROVIDER: Mat Browning, MD   REFERRING DIAG: R10.2 (ICD-10-CM) - Pelvic and perineal pain  THERAPY DIAG:  Muscle weakness (generalized)  Unspecified lack of coordination  Pelvic pain  Stress incontinence (female) (female)  Rationale for Evaluation and Treatment: Rehabilitation  ONSET DATE: 02/16/24   SUBJECTIVE:                                                                                                                                                                                           SUBJECTIVE STATEMENT: Patient reports that she cannot her bladder when she coughs and sneezes. Vaginal wall are rigid and bumpy.  Exercises went OK.  Patient reports that she is  exhausted, baby does not sleep at night and patient works full time.    Pt states that she has been having difficulty with urinary incontinence since vaginal delivery in March. She had c-section 3 years ago.    PAIN:  Are you having pain? No   PRECAUTIONS: None  RED FLAGS: None   WEIGHT BEARING RESTRICTIONS: No  FALLS:  Has patient fallen in last 6 months? No  OCCUPATION: sitting job  ACTIVITY LEVEL : no exercise  PLOF: Independent  PATIENT GOALS: decrease leaking, correct how vaginal opening looks  PERTINENT HISTORY:  C-section 09/06/2021, anxiety, depression,  Sexual  abuse: No  BOWEL MOVEMENT: Pain with bowel movement: No Type of bowel movement:Type (Bristol Stool Scale) 4, Frequency every other day, and Strain no Fully empty rectum: No Leakage: No Pads: Yes: see below Fiber supplement/laxative No  URINATION: Pain with urination: No Fully empty bladder: Yes:   Stream: Strong Urgency: No Frequency: every 2 hours Fluid Intake: 2-3 cups a day Leakage: Coughing, Sneezing, Laughing, and vomiting Pads: Yes: sometimes  INTERCOURSE:  Ability to have vaginal penetration Yes  Pain with intercourse: none DrynessNo Climax: WNL Marinoff Scale: 0/3 Lubricant:  PREGNANCY: Vaginal deliveries 1 Tearing Yes: 2nd degree tear Episiotomy No C-section deliveries 1 Currently pregnant No  PROLAPSE: None   OBJECTIVE:  Note: Objective measures were completed at Evaluation unless otherwise noted.  09/15/24 PATIENT SURVEYS:   PFIQ-7: 57  COGNITION: Overall cognitive status: Within functional limits for tasks assessed     SENSATION: Light touch: Appears intact   FUNCTIONAL TESTS:  Squat: weakness, unable to control down Single leg stance:  Rt: pelvic drop  Lt: pelvic drop Curl-up test: no distortion Sit-up test: 2/3 with abdominal distortion   GAIT: Assistive device utilized: None Comments: WNL  POSTURE: rounded shoulders, forward head, and  anterior pelvic tilt   LUMBARAROM/PROM: WNL    PALPATION:   General: WNL  Pelvic Alignment: anterior tilt  Abdominal: minimal scar tissue restriction from c-section                External Perineal Exam: perineal scar tissue that is making some perineal tissue bulge                             Internal Pelvic Floor: tenderness at perineal scar tissue; poor coordination of pelvic floor muscle contraction  Patient confirms identification and approves PT to assess internal pelvic floor and treatment Yes  PELVIC MMT:   MMT eval  Vaginal 2/5, 5 seconds, 7 repeat contractions  Diastasis Recti 3 finger width separation  (Blank rows = not tested)        TONE: WNL  PROLAPSE: Unable to tell due to poor coordination with bearing down  TODAY'S TREATMENT:                                                                                                                              DATE:  11/01/2024 Abdominal massage and scar massage Diaphragmatic breathing with #KB lift off table ( exhale to stabilize core first) 10 reps Seated ball press with exhale and pelvic floor lift bilateral 15 reps with VC's and TC's Exhale with horizontal abduction with theraband in supine with pelvic floor lift 20 reps Hip adduction with ball with exhale in supine 20 reps The knack with cough seated 5 reps Explanation of core and pelvic floor anatomy and how these muscles work together    09/15/24 EVAL  Neuromuscular re-education: Pt provides verbal consent for internal vaginal/rectal pelvic floor exam. Internal vaginal  pelvic floor muscle contraction training Quick flicks Long holds Bridge Therapeutic activities: Pt education on perineal scar tissue mobilization  For all possible CPT codes, reference the Planned Interventions line above.     Check all conditions that are expected to impact treatment: {Conditions expected to impact treatment:None of these apply   If treatment provided at initial  evaluation, no treatment charged due to lack of authorization.       PATIENT EDUCATION:  Education details: See above Person educated: Patient Education method: Explanation, Demonstration, Tactile cues, Verbal cues, and Handouts Education comprehension: verbalized understanding  HOME EXERCISE PROGRAM: [id number]  ASSESSMENT:  CLINICAL IMPRESSION: Patient did fairly well with her exercises today, she was exhausted and had to have some VC's and TC's for more optimal coordination with breath. She reported that she is doing her perineal massage at home but did not expect to be so affected after her vaginal delivery. Discussed the need for rest and good nutrition and to take care of herself as well so she can recover.Discussed to do HEP as she can, green thera band issued.   Patient with global weakness, will benefit from Patient to address deficits        Patient is a 43 y.o. female who was seen today for physical therapy evaluation and treatment for urinary incontinence and perineal scar tissue restriction following vaginal delivery March 2025. Exam findings notable for abnormal posture, core weakness with pelvic drop in standing, diastasis recti abdominus with distortion in sit-up test, minimal c-section scar tissue restriction, perineal scar tissue restriction that is tender, and pelvic floor muscle weakness. Signs and symptoms are most consistent with perineal scar tissue restriction and core and pelvic floor muscle weakness. Pt is most concerned about how her vaginal opening looks; we talked extensively about normal healing time frame for vaginal connective tissue being at least a year. She will likely see improvement in this as she continues to naturally heal, she gains pelvic floor muscle strength, and improves perineal scar tissue mobility. Believe the tissue she is concerned about is more prominent because is scar tissue restriction and as we release this she will feel better. Initial  treatment consisted of education on perineal scar tissue restriction and pelvic floor muscle strengthening. She will continue to benefit from skilled PT intervention in order to decrease pelvic pain, address impairments, and improve quality of life.   OBJECTIVE IMPAIRMENTS: decreased activity tolerance, decreased coordination, decreased endurance, decreased mobility, decreased ROM, decreased strength, increased fascial restrictions, increased muscle spasms, impaired flexibility, impaired tone, improper body mechanics, postural dysfunction, and pain.   ACTIVITY LIMITATIONS: continence  PARTICIPATION LIMITATIONS: interpersonal relationship, community activity, occupation, and exercise  PERSONAL FACTORS: 3+ comorbidities: medical history are also affecting patient's functional outcome.   REHAB POTENTIAL: Good  CLINICAL DECISION MAKING: Stable/uncomplicated  EVALUATION COMPLEXITY: Low   GOALS: Goals reviewed with patient? Yes  SHORT TERM GOALS: Target date: 10/13/2024   Pt will be independent with HEP in order to improve activity tolerance.   Baseline: Goal status: ongoing 11/01/2024  2. Pt will be independent with self-perineal scar tissue mobilization and perform 3-4x/week in order to decrease sensitivity and allow tissue to lie more naturally.   Baseline: tenderness, restriction, and tissue puckering Goal status: ongoing 11/01/24  3.  Pt will be independent with use of the knack in order to decrease stress urinary incontinence to improve personal hygiene.   Baseline: not using Goal status: INITIAL   LONG TERM GOALS: Target date: 03/02/25  Pt will be independent  with advanced HEP in order to improve activity tolerance.   Baseline:  Goal status: INITIAL  2.  Pt will report no leaks with laughing, coughing, sneezing in order to improve comfort with interpersonal relationships and community activities.   Baseline: leaking Goal status: INITIAL  3.  Pt will demonstrate normal  pelvic floor muscle tone and A/ROM, able to achieve 3/5 strength with contractions and 10 sec endurance, in order to reduce urinary leaking and number of pads patient wears.   Baseline: has to wear pad most days, strength 2/5, endurance 5 seconds Goal status: INITIAL  4.  Patient will be able to perform single leg stance with good pelvic stability in order to demonstrate appropriate pelvic floor muscle and transversus abdominus strength and coordination in order to decrease urinary incontinence.   Baseline: bil pelvic drop Goal status: INITIAL  5.  Pt will be able to perform 3/3 sit-up test without abdominal distortion in order to prevent vaginal wall laxity and low back pain.  Baseline: 2/3 with distortion Goal status: INITIAL  PLAN:  PT FREQUENCY: 1-2x/week  PT DURATION: 8 visits    PLANNED INTERVENTIONS: 97164- PT Re-evaluation, 97110-Therapeutic exercises, 97530- Therapeutic activity, 97112- Neuromuscular re-education, 97535- Self Care, 02859- Manual therapy, [phone]- Gait training, [phone]- Aquatic Therapy, [phone]- Electrical stimulation (unattended), [phone]- Traction (mechanical), [id number]- Ionotophoresis 4mg /ml Dexamethasone , 79439 (1-2 muscles), 20561 (3+ muscles)- Dry Needling, Patient/Family education, Balance training, Taping, Joint mobilization, Joint manipulation, Spinal manipulation, Spinal mobilization, Scar mobilization, Vestibular training, Cryotherapy, Moist heat, and Biofeedback  PLAN FOR NEXT SESSION: pt education on the knack; core strengthening; perineal scar tissue mobilization; return to pelvic floor muscle contraction training due to poor coordination   [place], PT, DPT12/09/255:14 PM [facility] [address], [address] [place], [place] [address] Phone # [phone] Fax [phone]

## 2024-11-10 ENCOUNTER — Ambulatory Visit: Admitting: Physical Therapy

## 2024-11-10 DIAGNOSIS — M6281 Muscle weakness (generalized): Secondary | ICD-10-CM

## 2024-11-10 DIAGNOSIS — R279 Unspecified lack of coordination: Secondary | ICD-10-CM

## 2024-11-10 DIAGNOSIS — R102 Pelvic and perineal pain unspecified side: Secondary | ICD-10-CM

## 2024-11-10 NOTE — Therapy (Signed)
 OUTPATIENT PHYSICAL THERAPY FEMALE PELVIC TREATMENT   Patient Name: Anne [name] MRN: [id number] DOB:[birth year], 43 y.o., female Today's Date: 11/10/2024  END OF SESSION:  PT End of Session - 11/10/24 1609     Visit Number 3    Date for Recertification  03/02/25    Authorization Type [facility] Medicaid    Authorization Time Period [employer] Approved (8 Visits) Eff. dates: 11/01/2024-01/22/2025 AUTH#: [id number]    Authorization - Visit Number 1    Authorization - Number of Visits 8    PT Start Time 0330    PT Stop Time 0410    PT Time Calculation (min) 40 min    Activity Tolerance Patient tolerated treatment well    Behavior During Therapy WFL for tasks assessed/performed         Past Medical History:  Diagnosis Date   Allergy    Anxiety    Depression    Past Surgical History:  Procedure Laterality Date   CESAREAN SECTION N/A 09/06/2021   Procedure: CESAREAN SECTION;  Surgeon: Rutherford Gain, MD;  Location: [facility] LD ORS;  Service: Obstetrics;  Laterality: N/A;   COSMETIC SURGERY     Patient Active Problem List   Diagnosis Date Noted   Desires VBAC (vaginal birth after cesarean) trial 02/16/2024   Vacuum extractor delivery, delivered 02/16/2024   Postpartum care following cesarean delivery 09/06/2021   Oligohydramnios antepartum, third trimester, fetus 1 09/05/2021   PCP: NA  REFERRING PROVIDER: Mat Browning, MD   REFERRING DIAG: R10.2 (ICD-10-CM) - Pelvic and perineal pain  THERAPY DIAG:  Muscle weakness (generalized)  Unspecified lack of coordination  Pelvic pain  Rationale for Evaluation and Treatment: Rehabilitation  ONSET DATE: 02/16/24   SUBJECTIVE:                                                                                                                                                                                           SUBJECTIVE STATEMENT: Patient reports that her pelvic floor is slowly getting a little better. No urinary leakage in  the past week or so. No issues with bowel movements. No intercourse pain to report.   Pt states that she has been having difficulty with urinary incontinence since vaginal delivery in March. She had c-section 3 years ago.   PAIN:  Are you having pain? No   PRECAUTIONS: None  RED FLAGS: None   WEIGHT BEARING RESTRICTIONS: No  FALLS:  Has patient fallen in last 6 months? No  OCCUPATION: sitting job  ACTIVITY LEVEL : no exercise  PLOF: Independent  PATIENT GOALS: decrease leaking, correct how vaginal opening looks  PERTINENT HISTORY:  C-section 09/06/2021,  anxiety, depression,  Sexual abuse: No  BOWEL MOVEMENT: Pain with bowel movement: No Type of bowel movement:Type (Bristol Stool Scale) 4, Frequency every other day, and Strain no Fully empty rectum: No Leakage: No Pads: Yes: see below Fiber supplement/laxative No  URINATION: Pain with urination: No Fully empty bladder: Yes:   Stream: Strong Urgency: No Frequency: every 2 hours Fluid Intake: 2-3 cups a day Leakage: Coughing, Sneezing, Laughing, and vomiting Pads: Yes: sometimes  INTERCOURSE:  Ability to have vaginal penetration Yes  Pain with intercourse: none DrynessNo Climax: WNL Marinoff Scale: 0/3 Lubricant:  PREGNANCY: Vaginal deliveries 1 Tearing Yes: 2nd degree tear Episiotomy No C-section deliveries 1 Currently pregnant No  PROLAPSE: None   OBJECTIVE:  Note: Objective measures were completed at Evaluation unless otherwise noted.  09/15/24 PATIENT SURVEYS:   PFIQ-7: 57  COGNITION: Overall cognitive status: Within functional limits for tasks assessed     SENSATION: Light touch: Appears intact   FUNCTIONAL TESTS:  Squat: weakness, unable to control down Single leg stance:  Rt: pelvic drop  Lt: pelvic drop Curl-up test: no distortion Sit-up test: 2/3 with abdominal distortion   GAIT: Assistive device utilized: None Comments: WNL  POSTURE: rounded shoulders, forward head,  and anterior pelvic tilt   LUMBARAROM/PROM: WNL    PALPATION:   General: WNL  Pelvic Alignment: anterior tilt  Abdominal: minimal scar tissue restriction from c-section                External Perineal Exam: perineal scar tissue that is making some perineal tissue bulge                             Internal Pelvic Floor: tenderness at perineal scar tissue; poor coordination of pelvic floor muscle contraction  Patient confirms identification and approves PT to assess internal pelvic floor and treatment Yes  PELVIC MMT:   MMT eval  Vaginal 2/5, 5 seconds, 7 repeat contractions  Diastasis Recti 3 finger width separation  (Blank rows = not tested)        TONE: WNL  PROLAPSE: Unable to tell due to poor coordination with bearing down  TODAY'S TREATMENT:                                                                                                                              DATE:  11/10/24: Seated diaphragmatic breathing + pelvic floor contraction  Seated quick flick pelvic floor contractions x20  Seated abdominal ball press + transverse abdominis contraction + diaphragmatic breathing 2x10  Bridge + diaphragmatic breathing x10  Seated hip adduction + diaphragmatic breathing 2x10  Internal vaginal muscle training in supine: Patient confirms identification and approves physical therapist to perform internal soft tissue work  Deep pelvic floor mobilization to promote blood flow to pelvic floor  Review of pelvic floor range of motion training with diaphragmatic breathing  Review of pelvic floor quick flick contractions  11/01/2024 Abdominal massage and scar massage Diaphragmatic breathing with #KB lift off table ( exhale to stabilize core first) 10 reps Seated ball press with exhale and pelvic floor lift bilateral 15 reps with VC's and TC's Exhale with horizontal abduction with theraband in supine with pelvic floor lift 20 reps Hip adduction with ball with exhale in  supine 20 reps The knack with cough seated 5 reps Explanation of core and pelvic floor anatomy and how these muscles work together  09/15/24 EVAL  Neuromuscular re-education: Pt provides verbal consent for internal vaginal/rectal pelvic floor exam. Internal vaginal pelvic floor muscle contraction training Quick flicks Long holds Bridge Therapeutic activities: Pt education on perineal scar tissue mobilization  For all possible CPT codes, reference the Planned Interventions line above.    Check all conditions that are expected to impact treatment: {Conditions expected to impact treatment:None of these apply   If treatment provided at initial evaluation, no treatment charged due to lack of authorization.     PATIENT EDUCATION:  Education details: See above Person educated: Patient Education method: Explanation, Demonstration, Tactile cues, Verbal cues, and Handouts Education comprehension: verbalized understanding  HOME EXERCISE PROGRAM: Access Code: [id number]  ASSESSMENT:  CLINICAL IMPRESSION: Patient is a 43 y.o. female who was seen today for physical therapy treatment for urinary incontinence and perineal scar tissue restriction following vaginal delivery March 2025. She has had no urinary leakage, no intercourse pain, and no bowel concerns to report. HEP reviewed today as pt has felt confused regarding some of them. Core and pelvic floor training progressed accordingly. Pt tolerated internal treatment to review pelvic floor range of motion training and she is getting more coordinated with contracting her pelvic floor and exhaling at the same time. No pain at end of session. She will continue to benefit from skilled PT intervention in order to decrease pelvic pain, address impairments, and improve quality of life.   OBJECTIVE IMPAIRMENTS: decreased activity tolerance, decreased coordination, decreased endurance, decreased mobility, decreased ROM, decreased strength, increased fascial  restrictions, increased muscle spasms, impaired flexibility, impaired tone, improper body mechanics, postural dysfunction, and pain.   ACTIVITY LIMITATIONS: continence  PARTICIPATION LIMITATIONS: interpersonal relationship, community activity, occupation, and exercise  PERSONAL FACTORS: 3+ comorbidities: medical history are also affecting patient's functional outcome.   REHAB POTENTIAL: Good  CLINICAL DECISION MAKING: Stable/uncomplicated  EVALUATION COMPLEXITY: Low   GOALS: Goals reviewed with patient? Yes  SHORT TERM GOALS: Target date: 10/13/2024   Pt will be independent with HEP in order to improve activity tolerance.   Baseline: Goal status: ongoing 11/01/2024  2. Pt will be independent with self-perineal scar tissue mobilization and perform 3-4x/week in order to decrease sensitivity and allow tissue to lie more naturally.   Baseline: tenderness, restriction, and tissue puckering Goal status: ongoing 11/01/24  3.  Pt will be independent with use of the knack in order to decrease stress urinary incontinence to improve personal hygiene.   Baseline: not using Goal status: INITIAL   LONG TERM GOALS: Target date: 03/02/25  Pt will be independent with advanced HEP in order to improve activity tolerance.   Baseline:  Goal status: INITIAL  2.  Pt will report no leaks with laughing, coughing, sneezing in order to improve comfort with interpersonal relationships and community activities.   Baseline: leaking Goal status: INITIAL  3.  Pt will demonstrate normal pelvic floor muscle tone and A/ROM, able to achieve 3/5 strength with contractions and 10 sec endurance, in order to reduce urinary leaking and number of pads  patient wears.   Baseline: has to wear pad most days, strength 2/5, endurance 5 seconds Goal status: INITIAL  4.  Patient will be able to perform single leg stance with good pelvic stability in order to demonstrate appropriate pelvic floor muscle and  transversus abdominus strength and coordination in order to decrease urinary incontinence.   Baseline: bil pelvic drop Goal status: INITIAL  5.  Pt will be able to perform 3/3 sit-up test without abdominal distortion in order to prevent vaginal wall laxity and low back pain.  Baseline: 2/3 with distortion Goal status: INITIAL  PLAN:  PT FREQUENCY: 1-2x/week  PT DURATION: 8 visits    PLANNED INTERVENTIONS: 97164- PT Re-evaluation, 97110-Therapeutic exercises, 97530- Therapeutic activity, 97112- Neuromuscular re-education, 97535- Self Care, 02859- Manual therapy, [phone]- Gait training, [phone]- Aquatic Therapy, [phone]- Electrical stimulation (unattended), [phone]- Traction (mechanical), [id number]- Ionotophoresis 4mg /ml Dexamethasone , 79439 (1-2 muscles), 20561 (3+ muscles)- Dry Needling, Patient/Family education, Balance training, Taping, Joint mobilization, Joint manipulation, Spinal manipulation, Spinal mobilization, Scar mobilization, Vestibular training, Cryotherapy, Moist heat, and Biofeedback  PLAN FOR NEXT SESSION: pt education on the knack; core strengthening; perineal scar tissue mobilization; return to pelvic floor muscle contraction training due to poor coordination   Celena Domino, PT, DPT 11/10/2024 4:10 PM [facility] [address] [place], [place] [address] Phone # [phone] Fax [phone]
